# Patient Record
Sex: Male | Born: 1950 | Race: White | Hispanic: No | Marital: Married | Smoking: Former smoker
Health system: Southern US, Community
[De-identification: ages and names within clinical notes are randomized; demographics above are authoritative.]

## PROBLEM LIST (undated history)

## (undated) DIAGNOSIS — M1990 Unspecified osteoarthritis, unspecified site: Secondary | ICD-10-CM

## (undated) DIAGNOSIS — Z7960 Long term (current) use of unspecified immunomodulators and immunosuppressants: Secondary | ICD-10-CM

## (undated) DIAGNOSIS — Z79899 Other long term (current) drug therapy: Secondary | ICD-10-CM

## (undated) DIAGNOSIS — D126 Benign neoplasm of colon, unspecified: Secondary | ICD-10-CM

## (undated) DIAGNOSIS — I209 Angina pectoris, unspecified: Secondary | ICD-10-CM

## (undated) DIAGNOSIS — M1120 Other chondrocalcinosis, unspecified site: Secondary | ICD-10-CM

## (undated) DIAGNOSIS — I480 Paroxysmal atrial fibrillation: Secondary | ICD-10-CM

## (undated) DIAGNOSIS — G4733 Obstructive sleep apnea (adult) (pediatric): Secondary | ICD-10-CM

## (undated) DIAGNOSIS — K5790 Diverticulosis of intestine, part unspecified, without perforation or abscess without bleeding: Secondary | ICD-10-CM

## (undated) DIAGNOSIS — K219 Gastro-esophageal reflux disease without esophagitis: Secondary | ICD-10-CM

## (undated) DIAGNOSIS — N400 Enlarged prostate without lower urinary tract symptoms: Secondary | ICD-10-CM

## (undated) DIAGNOSIS — I499 Cardiac arrhythmia, unspecified: Secondary | ICD-10-CM

## (undated) DIAGNOSIS — G2581 Restless legs syndrome: Secondary | ICD-10-CM

## (undated) DIAGNOSIS — N281 Cyst of kidney, acquired: Secondary | ICD-10-CM

## (undated) DIAGNOSIS — I10 Essential (primary) hypertension: Secondary | ICD-10-CM

## (undated) DIAGNOSIS — R0602 Shortness of breath: Secondary | ICD-10-CM

## (undated) DIAGNOSIS — M542 Cervicalgia: Secondary | ICD-10-CM

## (undated) DIAGNOSIS — Z950 Presence of cardiac pacemaker: Secondary | ICD-10-CM

## (undated) DIAGNOSIS — G4730 Sleep apnea, unspecified: Secondary | ICD-10-CM

## (undated) DIAGNOSIS — E669 Obesity, unspecified: Secondary | ICD-10-CM

## (undated) DIAGNOSIS — R002 Palpitations: Secondary | ICD-10-CM

## (undated) DIAGNOSIS — G5603 Carpal tunnel syndrome, bilateral upper limbs: Secondary | ICD-10-CM

## (undated) DIAGNOSIS — M5417 Radiculopathy, lumbosacral region: Secondary | ICD-10-CM

## (undated) DIAGNOSIS — K8020 Calculus of gallbladder without cholecystitis without obstruction: Secondary | ICD-10-CM

## (undated) DIAGNOSIS — I429 Cardiomyopathy, unspecified: Secondary | ICD-10-CM

## (undated) DIAGNOSIS — M51369 Other intervertebral disc degeneration, lumbar region without mention of lumbar back pain or lower extremity pain: Secondary | ICD-10-CM

## (undated) DIAGNOSIS — M75102 Unspecified rotator cuff tear or rupture of left shoulder, not specified as traumatic: Secondary | ICD-10-CM

## (undated) DIAGNOSIS — I495 Sick sinus syndrome: Secondary | ICD-10-CM

## (undated) DIAGNOSIS — T4145XA Adverse effect of unspecified anesthetic, initial encounter: Secondary | ICD-10-CM

## (undated) DIAGNOSIS — Z7901 Long term (current) use of anticoagulants: Secondary | ICD-10-CM

## (undated) DIAGNOSIS — R001 Bradycardia, unspecified: Secondary | ICD-10-CM

## (undated) DIAGNOSIS — N529 Male erectile dysfunction, unspecified: Secondary | ICD-10-CM

## (undated) DIAGNOSIS — T8859XA Other complications of anesthesia, initial encounter: Secondary | ICD-10-CM

## (undated) HISTORY — PX: KNEE ARTHROSCOPY: SUR90

## (undated) HISTORY — PX: JOINT REPLACEMENT: SHX530

## (undated) HISTORY — PX: VASECTOMY: SHX75

## (undated) HISTORY — PX: COLON SURGERY: SHX602

## (undated) HISTORY — PX: LIPOMA EXCISION: SHX5283

## (undated) HISTORY — PX: CHOLECYSTECTOMY: SHX55

## (undated) HISTORY — PX: OTHER SURGICAL HISTORY: SHX169

---

## 2003-09-22 ENCOUNTER — Other Ambulatory Visit: Payer: Self-pay

## 2005-08-31 ENCOUNTER — Ambulatory Visit: Payer: Self-pay | Admitting: Family Medicine

## 2006-05-25 ENCOUNTER — Ambulatory Visit: Payer: Self-pay | Admitting: Gastroenterology

## 2006-06-27 ENCOUNTER — Other Ambulatory Visit: Payer: Self-pay

## 2006-07-03 ENCOUNTER — Ambulatory Visit: Payer: Self-pay | Admitting: General Practice

## 2007-07-18 ENCOUNTER — Ambulatory Visit: Payer: Self-pay | Admitting: Family Medicine

## 2010-04-28 ENCOUNTER — Ambulatory Visit: Payer: Self-pay | Admitting: Gastroenterology

## 2010-05-02 ENCOUNTER — Ambulatory Visit: Payer: Self-pay | Admitting: Gastroenterology

## 2010-05-05 ENCOUNTER — Ambulatory Visit: Payer: Self-pay | Admitting: Gastroenterology

## 2010-05-26 ENCOUNTER — Ambulatory Visit: Payer: Self-pay | Admitting: Surgery

## 2010-06-09 ENCOUNTER — Ambulatory Visit: Payer: Self-pay | Admitting: Surgery

## 2010-09-16 ENCOUNTER — Ambulatory Visit: Payer: Self-pay | Admitting: General Practice

## 2010-09-28 ENCOUNTER — Inpatient Hospital Stay: Payer: Self-pay | Admitting: General Practice

## 2010-09-28 HISTORY — PX: OTHER SURGICAL HISTORY: SHX169

## 2010-09-28 HISTORY — PX: TOTAL KNEE ARTHROPLASTY: SHX125

## 2011-02-14 ENCOUNTER — Ambulatory Visit: Payer: Self-pay | Admitting: Otolaryngology

## 2012-01-06 IMAGING — US US THYROID
1 series · 17 of 25 positions shown · non-contrast
Comparison: none

REASON FOR EXAM: RT thyroid mass
COMMENTS:

[Series 1: us thyroid · 17 of 28 slices shown]
[im 1/28]
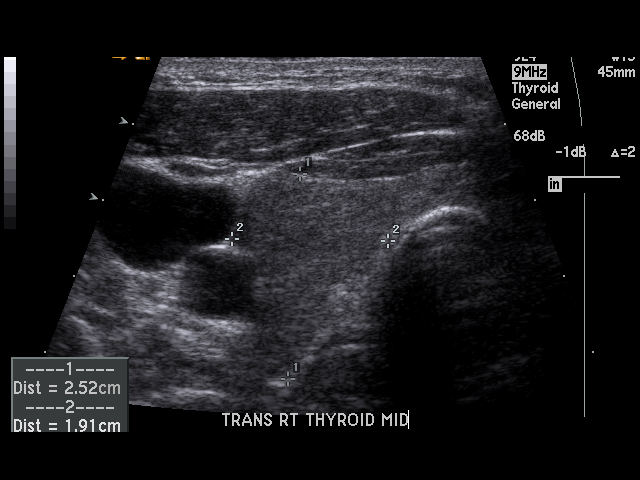
[im 3/28]
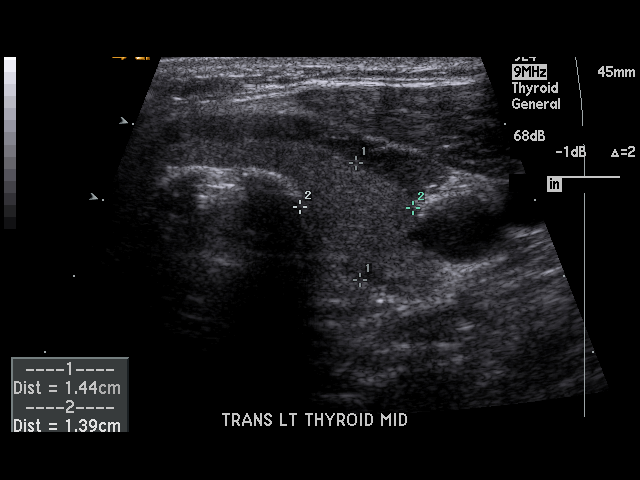
[im 4/28]
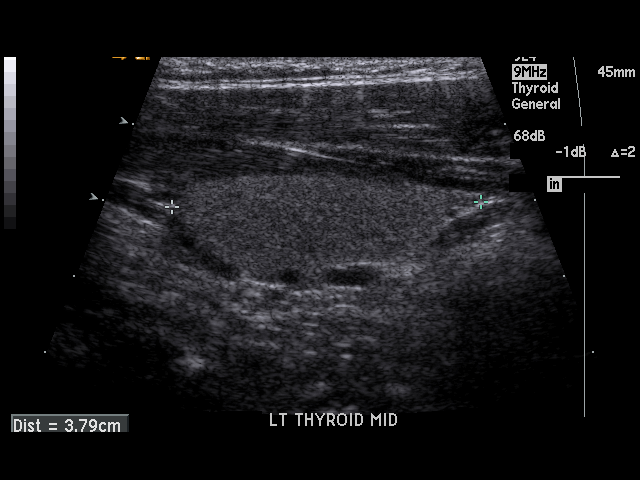
[im 6/28]
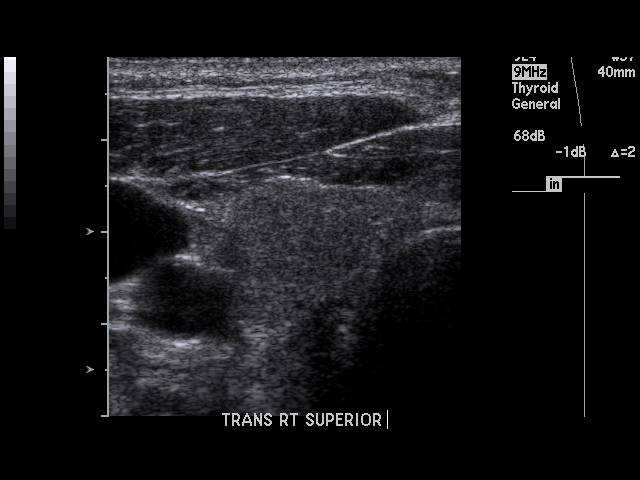
[im 7/28]
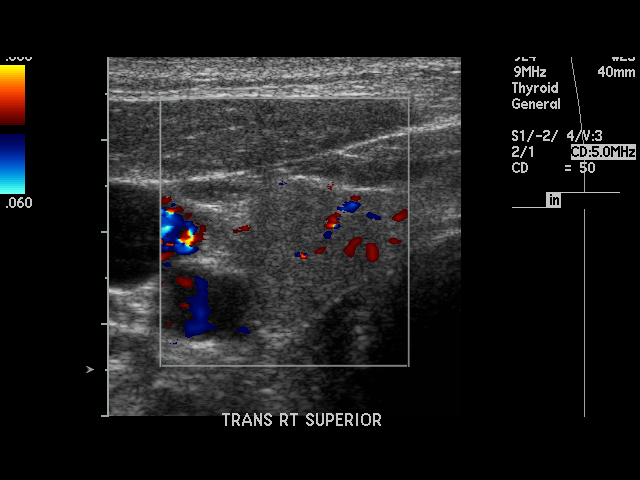
[im 10/28]
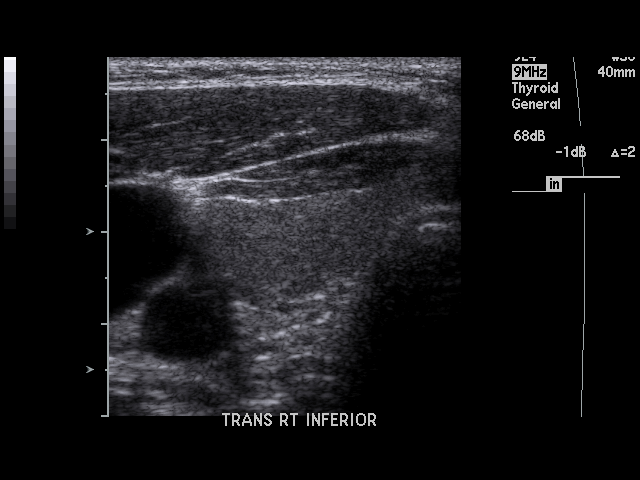
[im 11/28]
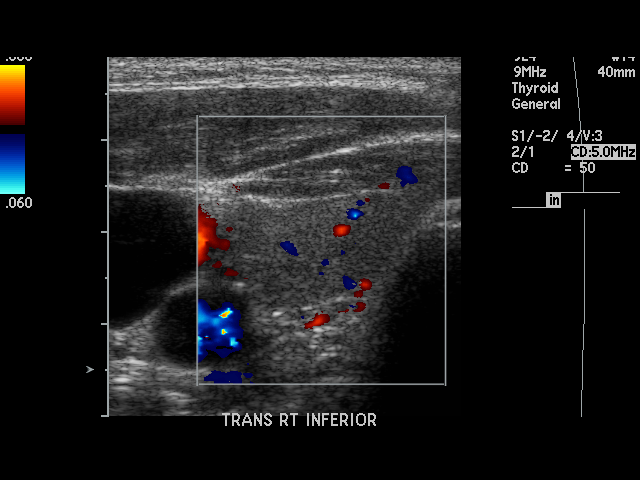
[im 13/28]
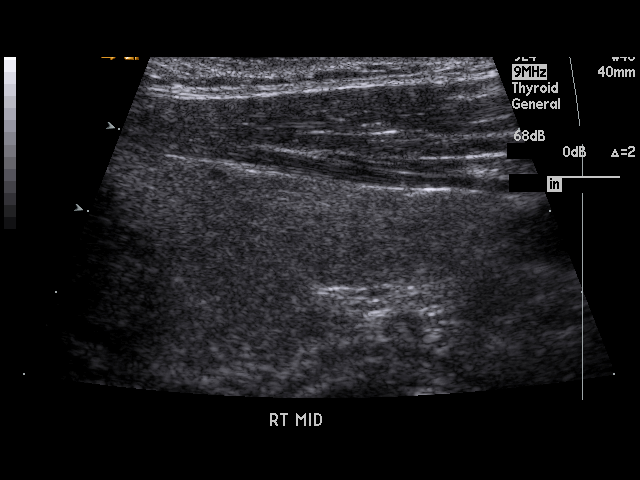
[im 14/28]
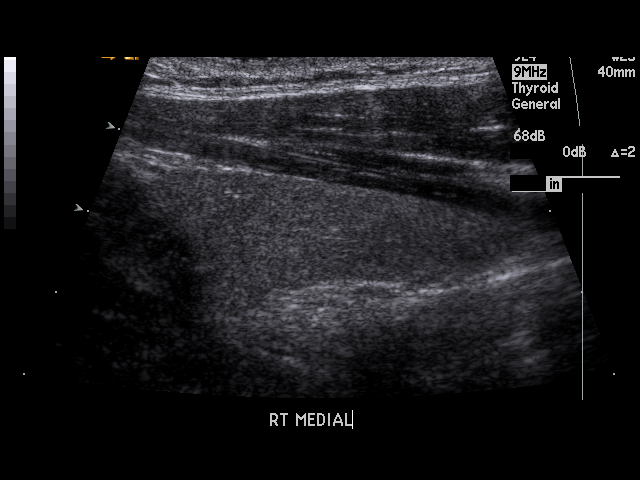
[im 15/28]
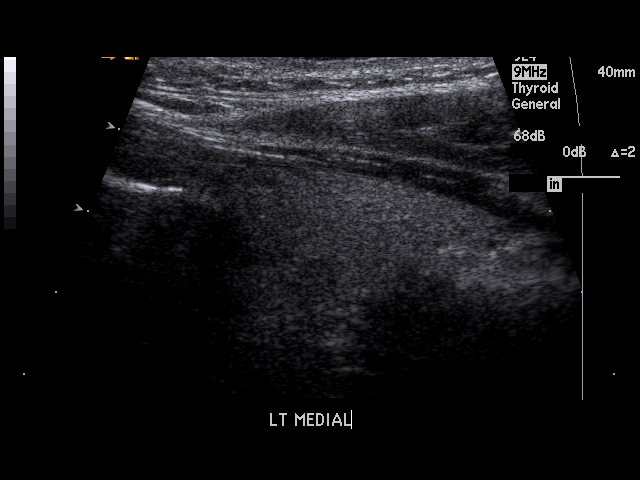
[im 17/28]
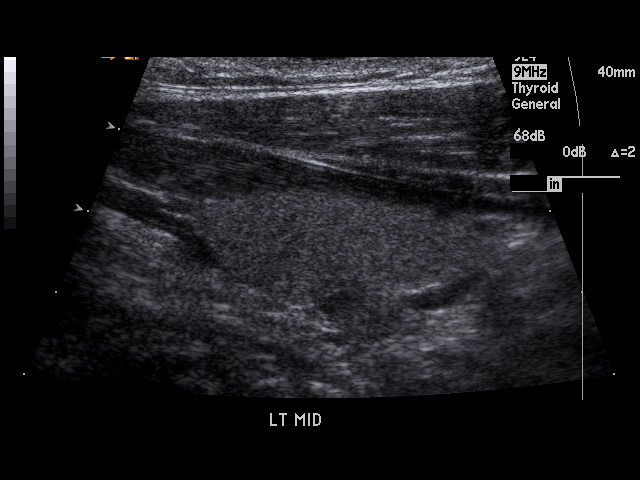
[im 19/28]
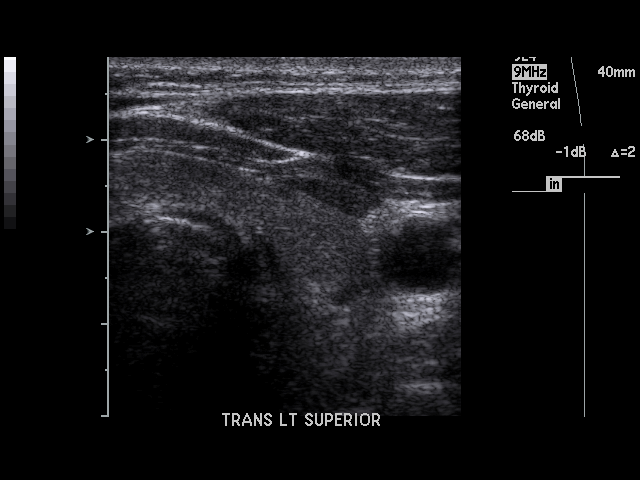
[im 21/28]
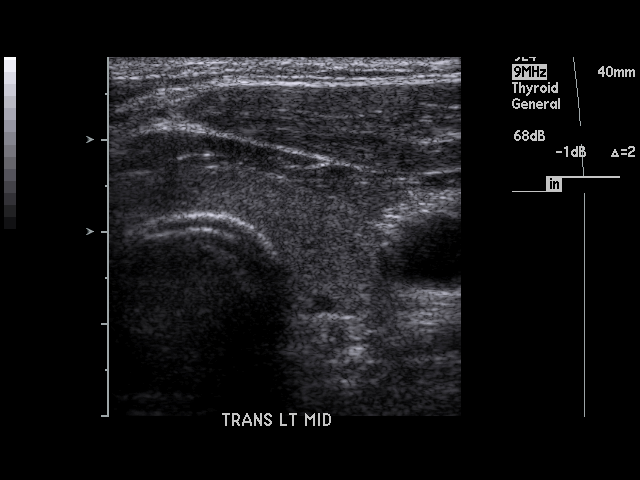
[im 22/28]
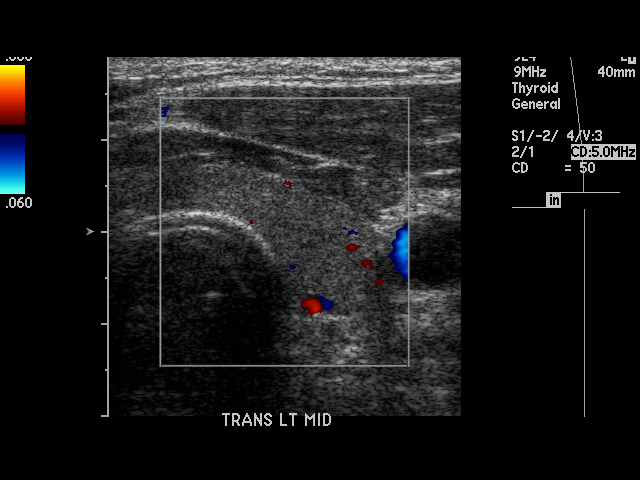
[im 24/28]
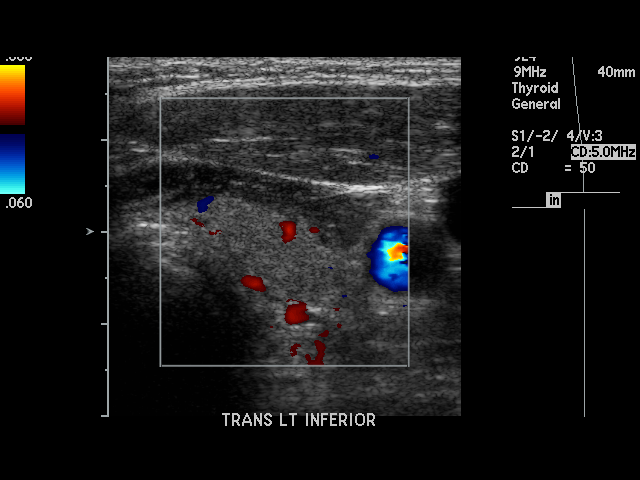
[im 25/28]
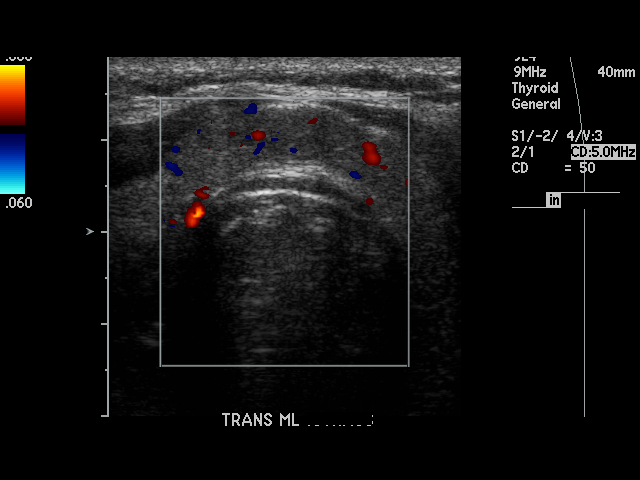
[im 28/28]
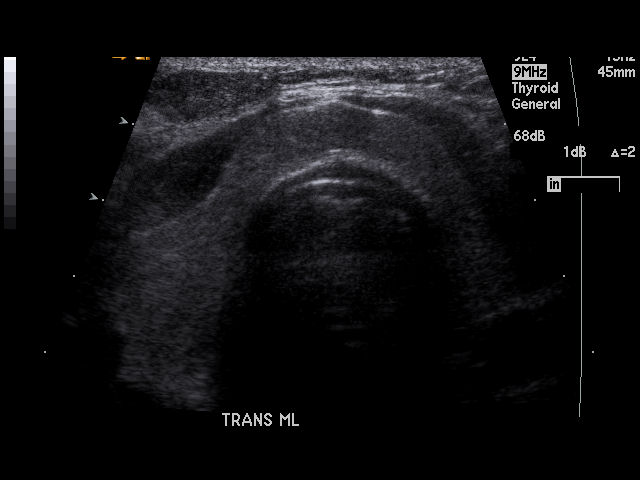

[17 of 25 positions shown; findings below may reference images not displayed]

PROCEDURE:     DINORA - DINORA THYROID  - February 14, 2011  [DATE]

RESULT:     The right lobe of thyroid measures 5.10 x 2.52 x 1.91 cm. The
left lobe measures 3.79 x 1.39 x 1.44 cm. The isthmus shows an anterior to
posterior thickness of 4.8 mm. Is no evidence of focal area of abnormal
echogenicity to suggest a solid or cystic mass.
IMPRESSION: Right lobe is at the upper limits of normal in size. No
focal mass evident.

## 2012-05-08 ENCOUNTER — Ambulatory Visit: Payer: Self-pay | Admitting: Family Medicine

## 2013-04-22 ENCOUNTER — Observation Stay: Payer: Self-pay | Admitting: Internal Medicine

## 2013-04-22 LAB — BASIC METABOLIC PANEL
Anion Gap: 8 (ref 7–16)
BUN: 9 mg/dL (ref 7–18)
Chloride: 103 mmol/L (ref 98–107)
Co2: 27 mmol/L (ref 21–32)
Creatinine: 1.11 mg/dL (ref 0.60–1.30)
EGFR (African American): >60
EGFR (Non-African Amer.): >60
Glucose: 78 mg/dL (ref 65–99)
Osmolality: 273 (ref 275–301)
Sodium: 138 mmol/L (ref 136–145)

## 2013-04-22 LAB — CBC
HCT: 39.3 % — ABNORMAL LOW (ref 40.0–52.0)
MCH: 30.2 pg (ref 26.0–34.0)
MCHC: 35.2 g/dL (ref 32.0–36.0)
MCV: 86 fL (ref 80–100)
Platelet: 164 10*3/uL (ref 150–440)
RBC: 4.58 10*6/uL (ref 4.40–5.90)
RDW: 13 % (ref 11.5–14.5)
WBC: 7.9 10*3/uL (ref 3.8–10.6)

## 2013-04-22 LAB — CK TOTAL AND CKMB (NOT AT ARMC): CK-MB: 7 ng/mL — ABNORMAL HIGH (ref 0.5–3.6)

## 2013-04-23 LAB — TROPONIN I: Troponin-I: <0.02 ng/mL

## 2013-05-20 ENCOUNTER — Ambulatory Visit: Payer: Self-pay | Admitting: Urology

## 2013-05-26 ENCOUNTER — Ambulatory Visit: Payer: Self-pay | Admitting: Urology

## 2013-12-25 DIAGNOSIS — N529 Male erectile dysfunction, unspecified: Secondary | ICD-10-CM | POA: Insufficient documentation

## 2013-12-25 DIAGNOSIS — R079 Chest pain, unspecified: Secondary | ICD-10-CM | POA: Insufficient documentation

## 2013-12-25 DIAGNOSIS — I10 Essential (primary) hypertension: Secondary | ICD-10-CM | POA: Insufficient documentation

## 2013-12-25 DIAGNOSIS — E8881 Metabolic syndrome: Secondary | ICD-10-CM | POA: Insufficient documentation

## 2013-12-25 DIAGNOSIS — K219 Gastro-esophageal reflux disease without esophagitis: Secondary | ICD-10-CM | POA: Insufficient documentation

## 2014-01-13 ENCOUNTER — Ambulatory Visit: Payer: Self-pay | Admitting: Family Medicine

## 2014-03-02 DIAGNOSIS — R0602 Shortness of breath: Secondary | ICD-10-CM | POA: Insufficient documentation

## 2014-07-02 DIAGNOSIS — J302 Other seasonal allergic rhinitis: Secondary | ICD-10-CM | POA: Insufficient documentation

## 2014-07-02 DIAGNOSIS — N400 Enlarged prostate without lower urinary tract symptoms: Secondary | ICD-10-CM | POA: Insufficient documentation

## 2014-07-02 DIAGNOSIS — G4730 Sleep apnea, unspecified: Secondary | ICD-10-CM | POA: Insufficient documentation

## 2015-01-08 NOTE — H&P (Signed)
PATIENT NAME:  [name], [name] MR#:  [id number] DATE OF BIRTH:  May 26, 1951  DATE OF ADMISSION:  05/26/2013  CHIEF COMPLAINT: Difficulty urinating.   HISTORY OF PRESENT ILLNESS: Mr. Valeriano is a 64 year old Caucasian male with a greater than 1 year history of progressive worsening of lower urinary tract symptoms. Symptoms at the present time include frequency, weak stream and nocturia. AUA symptom score was 16 with a quality of life score of 2. Evaluation in the office included uroflow study, cystoscopy, creatinine and prostate ultrasound. Studies were consistent with significant obstruction. Prostate ultrasound indicated a 50.3 gram prostate. PSA was 0.6 and creatinine was 1.09. The patient comes in now for photovaporization of the prostate with the green light laser.   ALLERGIES: No known drug allergies.   CURRENT MEDICATIONS: Include Avodart, doxazosin, lisinopril/HCTZ, omeprazole, aspirin, vitamin C, melatonin, vitamin B3, multivitamins, calcium, fluticasone.   PAST SURGICAL HISTORY:  1.  Bilateral vasectomy in the 1970s. 2.  Colonoscopic polyp removal in 2010. 3.  Right knee replacement in 2010. 4.  Cholecystectomy in 2012. 5.  Green light photovaporization of the prostate in 2000.   SOCIAL HISTORY: The patient denied tobacco or alcohol use.   FAMILY HISTORY: Remarkable for parents with heart disease, hypertension, chronic renal disease and stroke.   PAST AND CURRENT MEDICAL CONDITIONS:  1.  GERD. 2.  Hypertension.  3.  Obesity.   REVIEW OF SYSTEMS: The patient occasionally has ankle swelling. He denied chest pain, shortness of breath, diabetes, stroke, coronary artery disease or heart disease.   PHYSICAL EXAMINATION: GENERAL: Obese white male in no acute distress.  HEENT: Sclerae were clear. Pupils were equally round and reactive to light and accommodation. Extraocular movements were intact.  NECK: Supple. No palpable cervical adenopathy. No audible carotid bruits. Thyroid gland  was smooth and nontender. LYMPH:  No palpable neck or groin adenopathy.  PULMONARY: Lungs clear to auscultation. CARDIOVASCULAR: Regular rhythm and rate without audible murmurs or gallops.  ABDOMEN: Soft, nontender abdomen.  GENITOURINARY: Uncircumcised with buried penis. Testes smooth and nontender, 20 mL in size each.  RECTAL: Greater than 40 gram prostate.  NEUROMUSCULAR: Alert and oriented x 3.   IMPRESSION: Benign prostatic hypertrophy with bladder outlet obstruction.   PLAN: Photovaporization of the prostate with the green light laser.  ____________________________ Otelia Limes. Yves Dill, MD mrw:sb D: 05/20/2013 10:35:00 ET T: 05/20/2013 10:50:03 ET JOB#: [id number]  cc: Otelia Limes. Yves Dill, MD, <Dictator> Royston Cowper MD ELECTRONICALLY SIGNED 05/21/2013 11:53

## 2015-01-08 NOTE — Op Note (Signed)
PATIENT NAME:  [name], [name] MR#:  [id number] DATE OF BIRTH:  02-26-1951  DATE OF PROCEDURE:  05/26/2013  PREOPERATIVE DIAGNOSIS: Benign prostatic hypertrophy, with bladder outlet obstruction.   POSTOPERATIVE DIAGNOSIS: Benign prostatic hypertrophy, with bladder outlet obstruction.   PROCEDURE: Photovaporization of the prostate with the GreenLight laser.   SURGEON: Yves Dill.   ANESTHETIST: [employer] [facility].   ANESTHETIC METHOD: General, per [employer] [facility], and local per Yves Dill.   INDICATIONS: See the dictated history and physical. After informed consent, the patient requested the above procedure.   OPERATIVE SUMMARY: After adequate general anesthesia had been obtained, the patient was placed into dorsal lithotomy position and the perineum was prepped and draped in the usual fashion. The laser scope was then coupled with to camera and then visually advanced into the bladder. The bladder was moderately trabeculated. No bladder mucosal lesions were identified. Both ureteral orifices were identified and had clear efflux. The patient had trilobar BPH with intravesical growth of the median lobe.   At this point the GreenLight XPS laser fiber was introduced through the scope. The scope was then positioned at the bladder neck. Bladder neck was then vaporized at a power setting of  80 watts. Power was then increased to 120 watts, and remaining obstructive tissue was vaporized from the bladder neck back to the verumontanum. At this point, the scope was removed; 10 mL of viscous Xylocaine was instilled within the urethra and the bladder. A 95-MWUXLK silicone catheter was placed. The catheter was irrigated until clear. A B and O suppository was placed. The procedure was then terminated and the patient was transferred to the recovery room in stable condition.     ____________________________ Otelia Limes. Yves Dill, MD mrw:dm D: 05/26/2013 08:36:15 ET T: 05/26/2013 09:13:21 ET JOB#: [id number]  cc: Otelia Limes. Yves Dill,  MD, <Dictator> Royston Cowper MD ELECTRONICALLY SIGNED 05/26/2013 11:51

## 2015-01-08 NOTE — Discharge Summary (Signed)
PATIENT NAME:  [name], [name] MR#:  [id number] DATE OF BIRTH:  07-07-1951  DATE OF ADMISSION:  04/22/2013 DATE OF DISCHARGE:  04/23/2013  DISCHARGE DIAGNOSES: Chest pain, likely noncardiac with negative Myoview and negative serial cardiac enzymes. Could be muscular or gastrointestinal in origin.   SECONDARY DIAGNOSES:  1. Hypertension.  2. Obstructive sleep apnea.  3. Gastroesophageal reflux disease.  4. Benign prostatic hypertrophy.   CONSULTATIONS: None.   PROCEDURE AND RADIOLOGY: Myoview on the 6th of August was negative. EF of 53%. CT angio of chest on the 6th of August showed no evidence of PE. No evidence of thoracic aortic aneurysm or dissection. Noncalcified nodule within the left lower lobe. Chest x-ray on the 5th of August showed no acute cardiopulmonary disease.   HISTORY AND SHORT HOSPITAL COURSE: The patient is a 64 year old male with above-mentioned medical problems who was admitted for chest pain. Please see Dr. Zacarias Pontes dictated history and physical for further details. He was ruled out with 3 negative sets of troponin. He underwent Myoview which was negative. His chest pain was relieved and was close to his baseline. He was discharged home on the 6th of August in stable condition.   VITAL SIGNS: On the date of discharge, his vital signs are as follows: Temperature 97.2, heart rate 49 per minute, respirations 19 per minute, blood pressure 133/72 mmHg. He was saturating 96% on room air.   PERTINENT PHYSICAL EXAMINATION ON THE DATE OF DISCHARGE:  CARDIOVASCULAR: S1, S2 normal. No murmurs, rubs or gallop.  LUNGS: Clear to auscultation bilaterally. No wheezing, rales, rhonchi or crepitation.  ABDOMEN: Soft, benign.  NEUROLOGIC: Nonfocal examination.   All other physical examination remained at baseline.   DISCHARGE MEDICATIONS:  1. Doxazosin 8 mg 1/2 tablet p.o. daily.  2. Avodart 0.5 mg p.o. daily.  3. Hydrochlorothiazide/lisinopril 12.5/20 mg 1 tablet p.o. daily.  4.  Flonase 1 spray to each nares daily.  5. Melatonin 5 mg 2 tablets p.o. at bedtime.  6. Omeprazole 40 mg p.o. daily.  7. Calcium with vitamin D 1 tablet p.o. at bedtime.  8. Multivitamin once daily.  9. Aspirin 81 mg p.o. daily.  10. Vitamin C 1000 mg p.o. daily.   DISCHARGE DIET: Low sodium.   DISCHARGE ACTIVITY: As tolerated.   DISCHARGE INSTRUCTIONS AND FOLLOWUP: The patient was instructed to follow up with his primary care physician, Dr. Domenick Gong, in 1 to 2 weeks. He will need followup with [facility] in 2 to 4 weeks.   TOTAL TIME DISCHARGING THIS PATIENT: 45 minutes.   ____________________________ Lucina Mellow. Manuella Ghazi, MD vss:gb D: 04/23/2013 23:44:31 ET T: 04/24/2013 01:49:59 ET JOB#: [id number]  cc: Chia Rock S. Manuella Ghazi, MD, <Dictator> Fonnie Jarvis. Ilene Qua, MD [facility] Lucina Mellow [facility] MD ELECTRONICALLY SIGNED 04/25/2013 12:26

## 2015-01-08 NOTE — H&P (Signed)
PATIENT NAME:  [name], [name] MR#:  [id number] DATE OF BIRTH:  04/07/1951  DATE OF ADMISSION:  04/23/2013  PRIMARY CARE PHYSICIAN:  Dr. Domenick Gong.   REFERRING PHYSICIAN:  Dr. Harvest Dark.   CHIEF COMPLAINT:  Recurrent chest pain.   HISTORY OF PRESENT ILLNESS:  Mr. Pangilinan is a 64 year old Caucasian male with a history of hypertension, obstructive sleep apnea syndrome on CPAP and history of gastroesophageal reflux disease.  The patient was brought to the hospital for evaluation of chest pain.  He indicates that the chest pain started at 7:00 p.m. while sitting and without exertion.  He stated that prior to that he worked in the yard.  The chest pain is in the front of the chest radiating to the back and also radiating to the left shoulder.  The pain is described as sharp and aching with severity reaching 5 to 6 on a scale of 10, lasted about 20 minutes.  There is no associated shortness of breath.  No nausea, no vomiting.  He had similar pain earlier today in the morning, but was brief for about 15 minutes.  He indicates that this is the fourth episode over the last two weeks.  There is no precipitating factors or aggravating factors or relieving factors.  The patient was admitted to the hospital for further evaluation of his chest pain.   REVIEW OF SYSTEMS:  CONSTITUTIONAL:  Denies having any fever.  No chills.  No fatigue.  EYES:  No blurring of vision.  No double vision.  EARS, NOSE, THROAT:  No hearing impairment.  No sore throat.  No dysphagia.  CARDIOVASCULAR:  No shortness of breath.  No edema.  No syncope, but described the chest pain as above.  RESPIRATORY:  Chest pain as described.  No shortness of breath.  No cough.  No hemoptysis.  GASTROINTESTINAL:  No abdominal pain, no vomiting, no diarrhea.  GENITOURINARY:  No dysuria.  No frequency of urination.  MUSCULOSKELETAL:  No joint pain or swelling.  No muscular pain or swelling other than his chest pain.  INTEGUMENTARY:  No skin  rash.  No ulcers.  NEUROLOGY:  No focal weakness.  No seizure activity.  No headache.  PSYCHIATRY:  No anxiety.  No depression.  ENDOCRINE:  No polyuria or polydipsia.  No heat or cold intolerance.   PAST MEDICAL HISTORY:  Systemic hypertension, obstructive sleep apnea syndrome on CPAP at night, gastroesophageal reflux disease, benign prostatic hypertrophy.   PAST SURGICAL HISTORY:  Prostatic surgery, history of vasectomy in 1976, right knee joint arthroplasty in 2012.   FAMILY HISTORY:  His father died at the age of 66 from complications of stroke and he was in the preparation to have carotid surgery.  His mother died from old age and also complications of dementia and pneumonia.   SOCIAL HISTORY:  He is married, living with his wife.  He is retired from working as a [employer] manager.   SOCIAL HABITS:  Nonsmoker.  There is a remote history of smoking for a few years about 3 to 4 years.  He quit about 40 years ago.  No history of alcohol or drug abuse.   ADMISSION MEDICATIONS:  Vitamin C 1000 mg once a day, omeprazole 40 mg a day, multivitamin once a day, hydrochlorothiazide with lisinopril 12.5/20.  Melatonin 5 mg taking 2 tablets at night, fluticasone nasal spray once a day, doxazosin 4 mg once a day, calcium and vitamin D once a day, Avodart 0.5 mg once a day,  aspirin 81 mg a day.   ALLERGIES:  ANTIHISTAMINE CAUSING SOME MENTAL STATUS CHANGE OR HALLUCINATIONS.   PHYSICAL EXAMINATION: VITAL SIGNS:  Blood pressure 109/52.  Of note, his blood pressure at the time EMS evaluated him, transferred him to the Emergency Department, the blood pressure at that time was 150/81.  Pulse 54, temperature 98.1, oxygen saturation 94%.  GENERAL APPEARANCE:  A 64 year old male lying in bed in no acute distress.  HEAD AND NECK:  No pallor.  No icterus.  No cyanosis.  Ear examination revealed normal hearing, no discharge, no lesions.  Examination of the nose showed no discharge, no lesions, no bleeding.   Examination of the mouth showed normal lips and tongue, no ulcers, no oral thrush.  Eye examination revealed normal eyelids and conjunctivae.  Pupils about 4 mm, round, equal and reactive to light.  NECK:  Supple.  Trachea at midline.  No thyromegaly.  No cervical lymphadenopathy.  No masses.  HEART:  Revealed normal S1, S2.  No S3, S4.  No murmur.  No gallop.  No carotid bruits.  RESPIRATORY:  Revealed normal breathing pattern without use of accessory muscles.  No rales.  No wheezing.  ABDOMEN:  Soft without tenderness.  No hepatosplenomegaly.  No masses.  No hernias.  SKIN:  Revealed no ulcers.  No subcutaneous nodules.  MUSCULOSKELETAL:  No joint swelling.  No clubbing.  NEUROLOGIC:  Cranial nerves II through XII are intact.  No focal motor deficit.  PSYCHIATRIC:  The patient is alert, oriented x 3.  Mood and affect were normal.   LABORATORY FINDINGS:  His EKG showed sinus bradycardia at a rate of 59 per minute, otherwise unremarkable EKG.  His chest x-ray showed no acute cardiopulmonary abnormality.  His serum glucose 78, BUN 9, creatinine 1.1, sodium 138, potassium 3.6, calcium 8.7.  Total CPK 235.  Troponin less than 0.02.  CBC showed a white count of 7000, hemoglobin 13, hematocrit 39 and platelet count 164.   ASSESSMENT: 1.  Recurrent chest pain at mid chest area radiating to the back and left shoulder.  This is recurrent for the last two weeks.  Differential diagnosis will include cardiac pain versus pulmonary versus vascular, musculoskeletal versus gastrointestinal in origin.  2.  Systemic hypertension.  3.  Obstructive sleep apnea on CPAP treatment.  4.  Gastroesophageal reflux disease.  5.  Osteoarthritis.  6.  Benign prostatic hypertrophy.   PLAN:  We will admit to telemetry for observation, follow up on troponin q. 8 hours x 3.  We will obtain CT angiogram of the chest to rule out aortic dissection.  Oxygen supplementation, aspirin and small dose of beta-blocker using Coreg 3.125 mg  twice a day.  Try to avoid higher dose due to his bradycardia.  We will follow up on the troponins.  If they are normal, I already scheduled him to have a nuclear stress test.  Use CPAP for his sleep apnea.  We will place the patient on Lovenox subcutaneously for deep vein thrombosis prophylaxis.   Time spent in evaluating this patient took more than 55 minutes.    ____________________________ Clovis Pu. Lenore Manner, MD amd:ea D: 04/23/2013 00:04:00 ET T: 04/23/2013 02:12:59 ET JOB#: [id number]  cc: Clovis Pu. Lenore Manner, MD, <Dictator> Ellin Saba MD ELECTRONICALLY SIGNED 04/23/2013 6:21

## 2015-04-08 DIAGNOSIS — M5417 Radiculopathy, lumbosacral region: Secondary | ICD-10-CM | POA: Insufficient documentation

## 2015-04-08 DIAGNOSIS — M1812 Unilateral primary osteoarthritis of first carpometacarpal joint, left hand: Secondary | ICD-10-CM | POA: Insufficient documentation

## 2015-06-23 ENCOUNTER — Encounter: Payer: Self-pay | Admitting: *Deleted

## 2015-06-24 ENCOUNTER — Encounter
Admission: RE | Disposition: A | Discharge status: Home / Self Care | Payer: Self-pay | Source: Ambulatory Visit | Attending: Gastroenterology

## 2015-06-24 ENCOUNTER — Ambulatory Visit
Admission: RE | Admit: 2015-06-24 | Discharge: 2015-06-24 | Disposition: A | Discharge status: Home / Self Care | Payer: BLUE CROSS/BLUE SHIELD | Source: Ambulatory Visit | Attending: Gastroenterology | Admitting: Gastroenterology

## 2015-06-24 ENCOUNTER — Ambulatory Visit: Payer: BLUE CROSS/BLUE SHIELD | Admitting: Certified Registered"

## 2015-06-24 DIAGNOSIS — Z1211 Encounter for screening for malignant neoplasm of colon: Secondary | ICD-10-CM | POA: Diagnosis present

## 2015-06-24 DIAGNOSIS — Z800 Family history of malignant neoplasm of digestive organs: Secondary | ICD-10-CM | POA: Diagnosis not present

## 2015-06-24 DIAGNOSIS — K5730 Diverticulosis of large intestine without perforation or abscess without bleeding: Secondary | ICD-10-CM | POA: Insufficient documentation

## 2015-06-24 DIAGNOSIS — G4730 Sleep apnea, unspecified: Secondary | ICD-10-CM | POA: Diagnosis not present

## 2015-06-24 DIAGNOSIS — Z87891 Personal history of nicotine dependence: Secondary | ICD-10-CM | POA: Insufficient documentation

## 2015-06-24 DIAGNOSIS — Z86010 Personal history of colonic polyps: Secondary | ICD-10-CM | POA: Insufficient documentation

## 2015-06-24 DIAGNOSIS — R252 Cramp and spasm: Secondary | ICD-10-CM | POA: Diagnosis not present

## 2015-06-24 DIAGNOSIS — E669 Obesity, unspecified: Secondary | ICD-10-CM | POA: Insufficient documentation

## 2015-06-24 DIAGNOSIS — M1990 Unspecified osteoarthritis, unspecified site: Secondary | ICD-10-CM | POA: Diagnosis not present

## 2015-06-24 DIAGNOSIS — Z79899 Other long term (current) drug therapy: Secondary | ICD-10-CM | POA: Insufficient documentation

## 2015-06-24 DIAGNOSIS — N400 Enlarged prostate without lower urinary tract symptoms: Secondary | ICD-10-CM | POA: Insufficient documentation

## 2015-06-24 DIAGNOSIS — I10 Essential (primary) hypertension: Secondary | ICD-10-CM | POA: Diagnosis not present

## 2015-06-24 DIAGNOSIS — Z6839 Body mass index (BMI) 39.0-39.9, adult: Secondary | ICD-10-CM | POA: Diagnosis not present

## 2015-06-24 DIAGNOSIS — K219 Gastro-esophageal reflux disease without esophagitis: Secondary | ICD-10-CM | POA: Insufficient documentation

## 2015-06-24 HISTORY — DX: Male erectile dysfunction, unspecified: N52.9

## 2015-06-24 HISTORY — DX: Essential (primary) hypertension: I10

## 2015-06-24 HISTORY — DX: Diverticulosis of intestine, part unspecified, without perforation or abscess without bleeding: K57.90

## 2015-06-24 HISTORY — PX: COLONOSCOPY WITH PROPOFOL: SHX5780

## 2015-06-24 HISTORY — DX: Obesity, unspecified: E66.9

## 2015-06-24 HISTORY — DX: Benign prostatic hyperplasia without lower urinary tract symptoms: N40.0

## 2015-06-24 HISTORY — DX: Benign neoplasm of colon, unspecified: D12.6

## 2015-06-24 HISTORY — DX: Angina pectoris, unspecified: I20.9

## 2015-06-24 HISTORY — DX: Gastro-esophageal reflux disease without esophagitis: K21.9

## 2015-06-24 HISTORY — DX: Sleep apnea, unspecified: G47.30

## 2015-06-24 HISTORY — DX: Radiculopathy, lumbosacral region: M54.17

## 2015-06-24 HISTORY — DX: Shortness of breath: R06.02

## 2015-06-24 HISTORY — DX: Unspecified osteoarthritis, unspecified site: M19.90

## 2015-06-24 SURGERY — COLONOSCOPY WITH PROPOFOL
Anesthesia: General

## 2015-06-24 MED ORDER — PROPOFOL 10 MG/ML IV BOLUS
INTRAVENOUS | Status: DC | PRN
  Administered 2015-06-24: 40 mg via INTRAVENOUS

## 2015-06-24 MED ORDER — LIDOCAINE HCL (CARDIAC) 20 MG/ML IV SOLN
INTRAVENOUS | Status: DC | PRN
Start: 2015-06-24 — End: 2015-06-24
  Administered 2015-06-24: 60 mg via INTRAVENOUS

## 2015-06-24 MED ORDER — SODIUM CHLORIDE 0.9 % IV SOLN
INTRAVENOUS | Status: DC
  Administered 2015-06-24 (×2): via INTRAVENOUS

## 2015-06-24 MED ORDER — EPHEDRINE SULFATE 50 MG/ML IJ SOLN
INTRAMUSCULAR | Status: DC | PRN
  Administered 2015-06-24 (×2): 10 mg via INTRAVENOUS
  Administered 2015-06-24: 5 mg via INTRAVENOUS

## 2015-06-24 MED ORDER — GLYCOPYRROLATE 0.2 MG/ML IJ SOLN
INTRAMUSCULAR | Status: DC | PRN
  Administered 2015-06-24: 0.2 mg via INTRAVENOUS

## 2015-06-24 MED ORDER — SODIUM CHLORIDE 0.9 % IV SOLN: INTRAVENOUS | Status: DC

## 2015-06-24 MED ORDER — PROPOFOL 500 MG/50ML IV EMUL
INTRAVENOUS | Status: DC | PRN
  Administered 2015-06-24: 140 ug/kg/min via INTRAVENOUS

## 2015-06-24 NOTE — Transfer of Care (Signed)
Immediate Anesthesia Transfer of Care Note  Patient: [name]  Procedure(s) Performed: Procedure(s): COLONOSCOPY WITH PROPOFOL (N/A)  Patient Location: Endoscopy Unit  Anesthesia Type:General  Level of Consciousness: awake, alert , oriented and patient cooperative  Airway & Oxygen Therapy: Patient Spontanous Breathing and Patient connected to nasal cannula oxygen  Post-op Assessment: Report given to RN, Post -op Vital signs reviewed and stable and Patient moving all extremities X 4  Post vital signs: Reviewed and stable  Last Vitals:  Filed Vitals:   06/24/15 0826  BP: 102/48  Pulse: 65  Temp: 36.3 C  Resp: 14    Complications: No apparent anesthesia complications

## 2015-06-24 NOTE — Anesthesia Postprocedure Evaluation (Signed)
  Anesthesia Post-op Note  Patient: [name]  Procedure(s) Performed: Procedure(s): COLONOSCOPY WITH PROPOFOL (N/A)  Anesthesia type:General  Patient location: PACU  Post pain: Pain level controlled  Post assessment: Post-op Vital signs reviewed, Patient's Cardiovascular Status Stable, Respiratory Function Stable, Patent Airway and No signs of Nausea or vomiting  Post vital signs: Reviewed and stable  Last Vitals:  Filed Vitals:   06/24/15 0830  BP: 96/52  Pulse: 69  Temp:   Resp: 15    Level of consciousness: awake, alert  and patient cooperative  Complications: No apparent anesthesia complications

## 2015-06-24 NOTE — H&P (Signed)
Primary Care Physician:  [facility], MD Primary Gastroenterologist:  Dr. Candace Cruise  Pre-Procedure History & Physical: HPI:  [name] is a 64 y.o. male is here for an EGD/colonoscopy  Past Medical History  Diagnosis Date  . GERD (gastroesophageal reflux disease)   . Anginal pain ([place])   . Hypertension   . Sleep apnea   . Obesity   . Erectile dysfunction   . SOB (shortness of breath)   . BPH (benign prostatic hyperplasia)   . Osteoarthritis     first metacarpal of left hand  . Lumbosacral radiculitis   . Adenomatous polyp of colon   . Diverticulosis     Past Surgical History  Procedure Laterality Date  . Prostratate surgery    . Excision of of lipoma    . Colon surgery    . Cholecystectomy    . Vasectomy    . Knee artthroscopy Right   . Joint replacement    . Rtk  Right     total knee    Prior to Admission medications   Medication Sig Start Date End Date Taking? Authorizing Provider  Ascorbic Acid (VITAMIN C) 100 MG tablet Take 100 mg by mouth daily.   Yes Historical Provider, MD  calcium carbonate (OSCAL) 1500 (600 CA) MG TABS tablet Take 600 mg of elemental calcium by mouth 2 (two) times daily with a meal.   Yes Historical Provider, MD  cyclobenzaprine (FLEXERIL) 10 MG tablet Take 10 mg by mouth 3 (three) times daily as needed for muscle spasms.   Yes Historical Provider, MD  Ergocalciferol (VITAMIN D2) 2000 UNITS TABS Take 1 tablet by mouth.   Yes Historical Provider, MD  etodolac (LODINE) 500 MG tablet Take 500 mg by mouth 2 (two) times daily.   Yes Historical Provider, MD  fluticasone (FLONASE) 50 MCG/ACT nasal spray Place 2 sprays into both nostrils daily.   Yes Historical Provider, MD  furosemide (LASIX) 40 MG tablet Take 40 mg by mouth daily.   Yes Historical Provider, MD  lisinopril-hydrochlorothiazide (PRINZIDE,ZESTORETIC) 20-12.5 MG tablet Take 1 tablet by mouth daily.   Yes Historical Provider, MD  Melatonin 1 MG CAPS Take 1 mg by mouth.   Yes  Historical Provider, MD  Multiple Vitamin (MULTIVITAMIN) capsule Take 1 capsule by mouth daily.   Yes Historical Provider, MD  omeprazole (PRILOSEC) 40 MG capsule Take 40 mg by mouth daily.   Yes Historical Provider, MD    Allergies as of 04/28/2015  . (Not on File)    History reviewed. No pertinent family history.  Social History   Social History  . Marital Status: Married    Spouse Name: N/A  . Number of Children: N/A  . Years of Education: N/A   Occupational History  . Not on file.   Social History Main Topics  . Smoking status: Former Research scientist (life sciences)  . Smokeless tobacco: Not on file  . Alcohol Use: No  . Drug Use: No  . Sexual Activity: Not on file   Other Topics Concern  . Not on file   Social History Narrative  . No narrative on file    Review of Systems: See HPI, otherwise negative ROS  Physical Exam: BP 124/54 mmHg  Pulse 54  Temp(Src) 97.3 F (36.3 C) (Tympanic)  Resp 17  Ht 5\' 11"  (1.803 m)  Wt 127.007 kg (280 lb)  BMI 39.07 kg/m2  SpO2 99% General:   Alert,  pleasant and cooperative in NAD Head:  Normocephalic and  atraumatic. Neck:  Supple; no masses or thyromegaly. Lungs:  Clear throughout to auscultation.    Heart:  Regular rate and rhythm. Abdomen:  Soft, nontender and nondistended. Normal bowel sounds, without guarding, and without rebound.   Neurologic:  Alert and  oriented x4;  grossly normal neurologically.  Impression/Plan: [name] is here for an colonoscopy to be performed for family hx of colon cancer/personal hx of colon polyps.  Risks, benefits, limitations, and alternatives regarding  colonoscopy have been reviewed with the patient.  Questions have been answered.  All parties agreeable.   Fe Okubo, Lupita Dawn, MD  06/24/2015, 8:01 AM

## 2015-06-24 NOTE — Anesthesia Preprocedure Evaluation (Signed)
Anesthesia Evaluation  Patient identified by MRN, date of birth, ID band Patient awake    Reviewed: Allergy & Precautions, NPO status , Patient's Chart, lab work & pertinent test results  Airway Mallampati: II       Dental no notable dental hx.    Pulmonary sleep apnea , former smoker,     + decreased breath sounds      Cardiovascular Exercise Tolerance: Good hypertension, Pt. on medications Normal cardiovascular exam     Neuro/Psych negative neurological ROS     GI/Hepatic Neg liver ROS, GERD  ,  Endo/Other  negative endocrine ROS  Renal/GU negative Renal ROS     Musculoskeletal negative musculoskeletal ROS (+)   Abdominal (+) + obese,   Peds negative pediatric ROS (+)  Hematology negative hematology ROS (+)   Anesthesia Other Findings   Reproductive/Obstetrics                             Anesthesia Physical Anesthesia Plan  ASA: II  Anesthesia Plan: General   Post-op Pain Management:    Induction: Intravenous  Airway Management Planned: Nasal Cannula  Additional Equipment:   Intra-op Plan:   Post-operative Plan:   Informed Consent: I have reviewed the patients History and Physical, chart, labs and discussed the procedure including the risks, benefits and alternatives for the proposed anesthesia with the patient or authorized representative who has indicated his/her understanding and acceptance.     Plan Discussed with: CRNA  Anesthesia Plan Comments:         Anesthesia Quick Evaluation

## 2015-06-24 NOTE — Op Note (Signed)
[facility] Gastroenterology Patient Name: [name] Procedure Date: 06/24/2015 8:01 AM MRN: [id number] Account #: [id number] Date of Birth: 03/01/1951 Admit Type: Outpatient Age: 64 Room: [facility] ENDO ROOM 4 Gender: Male Note Status: Finalized Procedure:         Colonoscopy Indications:       Family history of colon cancer in a first-degree relative,                     Personal history of colonic polyps Providers:         Lupita Dawn. Candace Cruise, MD Referring MD:      Sofie Hartigan (Referring MD) Medicines:         Monitored Anesthesia Care Complications:     No immediate complications. Procedure:         Pre-Anesthesia Assessment:                    - Prior to the procedure, a History and Physical was                     performed, and patient medications, allergies and                     sensitivities were reviewed. The patient's tolerance of                     previous anesthesia was reviewed.                    - The risks and benefits of the procedure and the sedation                     options and risks were discussed with the patient. All                     questions were answered and informed consent was obtained.                    - After reviewing the risks and benefits, the patient was                     deemed in satisfactory condition to undergo the procedure.                    After obtaining informed consent, the colonoscope was                     passed under direct vision. Throughout the procedure, the                     patient's blood pressure, pulse, and oxygen saturations                     were monitored continuously. The Olympus PCF-H180AL                     colonoscope ( S#: [id number] ) was introduced through the                     anus and advanced to the the cecum, identified by                     appendiceal orifice and ileocecal valve. The colonoscopy  was performed without difficulty. The patient tolerated                   the procedure well. The quality of the bowel preparation                     was fair. Findings:      Multiple small-mouthed diverticula were found in the sigmoid colon.      The exam was otherwise without abnormality. Impression:        - Diverticulosis in the sigmoid colon.                    - The examination was otherwise normal.                    - No specimens collected. Recommendation:    - Discharge patient to home.                    - Repeat colonoscopy in 5 years for surveillance.                    - The findings and recommendations were discussed with the                     patient. Procedure Code(s): --- Professional ---                    [phone], Colonoscopy, flexible; diagnostic, including                     collection of specimen(s) by brushing or washing, when                     performed (separate procedure) Diagnosis Code(s): --- Professional ---                    Z80.0, Family history of malignant neoplasm of digestive                     organs                    Z86.010, Personal history of colonic polyps                    K57.30, Diverticulosis of large intestine without                     perforation or abscess without bleeding CPT copyright 2014 American Medical Association. All rights reserved. The codes documented in this report are preliminary and upon coder review may  be revised to meet current compliance requirements. Hulen Luster, MD 06/24/2015 8:20:46 AM This report has been signed electronically. Number of Addenda: 0 Note Initiated On: 06/24/2015 8:01 AM Scope Withdrawal Time: 0 hours 6 minutes 43 seconds  Total Procedure Duration: 0 hours 11 minutes 55 seconds       [facility]

## 2015-06-28 ENCOUNTER — Encounter: Payer: Self-pay | Admitting: Gastroenterology

## 2015-08-17 ENCOUNTER — Other Ambulatory Visit: Payer: BLUE CROSS/BLUE SHIELD

## 2015-08-18 ENCOUNTER — Other Ambulatory Visit: Payer: Self-pay | Admitting: Family Medicine

## 2015-08-18 DIAGNOSIS — M5412 Radiculopathy, cervical region: Secondary | ICD-10-CM

## 2015-08-18 DIAGNOSIS — G2581 Restless legs syndrome: Secondary | ICD-10-CM | POA: Insufficient documentation

## 2015-08-18 DIAGNOSIS — R7302 Impaired glucose tolerance (oral): Secondary | ICD-10-CM | POA: Insufficient documentation

## 2015-09-02 ENCOUNTER — Ambulatory Visit: Admission: RE | Admit: 2015-09-02 | Payer: BLUE CROSS/BLUE SHIELD | Source: Ambulatory Visit

## 2015-09-23 ENCOUNTER — Ambulatory Visit: Payer: BLUE CROSS/BLUE SHIELD

## 2015-10-07 ENCOUNTER — Encounter
Admission: RE | Admit: 2015-10-07 | Discharge: 2015-10-07 | Disposition: A | Discharge status: Home / Self Care | Payer: BLUE CROSS/BLUE SHIELD | Source: Ambulatory Visit | Attending: Orthopedic Surgery | Admitting: Orthopedic Surgery

## 2015-10-07 DIAGNOSIS — I10 Essential (primary) hypertension: Secondary | ICD-10-CM

## 2015-10-07 DIAGNOSIS — Z01812 Encounter for preprocedural laboratory examination: Secondary | ICD-10-CM | POA: Diagnosis present

## 2015-10-07 DIAGNOSIS — Z01810 Encounter for preprocedural cardiovascular examination: Secondary | ICD-10-CM | POA: Insufficient documentation

## 2015-10-07 HISTORY — DX: Adverse effect of unspecified anesthetic, initial encounter: T41.45XA

## 2015-10-07 HISTORY — DX: Other complications of anesthesia, initial encounter: T88.59XA

## 2015-10-07 LAB — CBC
HEMATOCRIT: 43 % (ref 40.0–52.0)
HEMOGLOBIN: 14.8 g/dL (ref 13.0–18.0)
MCH: 30.4 pg (ref 26.0–34.0)
MCHC: 34.5 g/dL (ref 32.0–36.0)
MCV: 88.2 fL (ref 80.0–100.0)
PLATELETS: 137 10*3/uL — AB (ref 150–440)
RBC: 4.88 MIL/uL (ref 4.40–5.90)
RDW: 13.5 % (ref 11.5–14.5)
WBC: 5.5 10*3/uL (ref 3.8–10.6)

## 2015-10-07 LAB — SEDIMENTATION RATE: Sed Rate: 6 mm/hr (ref 0–20)

## 2015-10-07 LAB — BASIC METABOLIC PANEL
ANION GAP: 8 (ref 5–15)
BUN: 16 mg/dL (ref 6–20)
CO2: 29 mmol/L (ref 22–32)
Calcium: 9.4 mg/dL (ref 8.9–10.3)
Chloride: 101 mmol/L (ref 101–111)
Creatinine, Ser: 0.99 mg/dL (ref 0.61–1.24)
GLUCOSE: 90 mg/dL (ref 65–99)
POTASSIUM: 4 mmol/L (ref 3.5–5.1)
Sodium: 138 mmol/L (ref 135–145)

## 2015-10-07 LAB — TYPE AND SCREEN
ABO/RH(D): B NEG
ANTIBODY SCREEN: NEGATIVE

## 2015-10-07 LAB — URINALYSIS COMPLETE WITH MICROSCOPIC (ARMC ONLY)
BACTERIA UA: NONE SEEN
BILIRUBIN URINE: NEGATIVE
GLUCOSE, UA: NEGATIVE mg/dL
Hgb urine dipstick: NEGATIVE
KETONES UR: NEGATIVE mg/dL
LEUKOCYTES UA: NEGATIVE
Nitrite: NEGATIVE
Protein, ur: NEGATIVE mg/dL
SQUAMOUS EPITHELIAL / LPF: NONE SEEN
Specific Gravity, Urine: 1.005 (ref 1.005–1.030)
WBC, UA: NONE SEEN WBC/hpf (ref 0–5)
pH: 6 (ref 5.0–8.0)

## 2015-10-07 LAB — SURGICAL PCR SCREEN
MRSA, PCR: NEGATIVE
STAPHYLOCOCCUS AUREUS: NEGATIVE

## 2015-10-07 LAB — ABO/RH: ABO/RH(D): B NEG

## 2015-10-07 LAB — PROTIME-INR
INR: 1.15
PROTHROMBIN TIME: 14.9 s (ref 11.4–15.0)

## 2015-10-07 LAB — APTT: APTT: 31 s (ref 24–36)

## 2015-10-07 NOTE — Patient Instructions (Signed)
  Your procedure is scheduled on: Monday 10/25/2015 Report to Day Surgery. 2ND FLOOR MEDICAL MALL ENTRANCE To find out your arrival time please call [phone] between 1PM - 3PM on Friday 10/22/2015.  Remember: Instructions that are not followed completely may result in serious medical risk, up to and including death, or upon the discretion of your surgeon and anesthesiologist your surgery may need to be rescheduled.    __X__ 1. Do not eat food or drink liquids after midnight. No gum chewing or hard candies.     __X__ 2. No Alcohol for 24 hours before or after surgery.   ____ 3. Bring all medications with you on the day of surgery if instructed.    __X__ 4. Notify your doctor if there is any change in your medical condition     (cold, fever, infections).     Do not wear jewelry, make-up, hairpins, clips or nail polish.  Do not wear lotions, powders, or perfumes. .  Do not shave 48 hours prior to surgery. Men may shave face and neck.  Do not bring valuables to the hospital.    [facility] is not responsible for any belongings or valuables               Contacts, dentures or bridgework may not be worn into surgery.  Leave your suitcase in the car. After surgery it may be brought to your room.  For patients admitted to the hospital, discharge time is determined by your                treatment team.   Patients discharged the day of surgery will not be allowed to drive home.   Please read over the following fact sheets that you were given:   MRSA Information and Surgical Site Infection Prevention   __X__ Take these medicines the morning of surgery with A SIP OF WATER:    1. DOXAZOSIN, LISINOPRIL/HCTZ, OMEPRAZOLE AT NIGHT AS USUAL  2. EXTRA DOSE OMEPRAZOLE DAY OF SURGERY  3.   4.  5.  6.  ____ Fleet Enema (as directed)   __X__ Use CHG Soap as directed  ____ Use inhalers on the day of surgery  ____ Stop metformin 2 days prior to surgery    ____ Take 1/2 of usual insulin dose  the night before surgery and none on the morning of surgery.   __X__ Stop Coumadin/Plavix/aspirin on 10/16/2015  __X__ Stop Anti-inflammatories on 10/18/2015 (NAPROXEN)   __X__ Stop supplements until after surgery. 10/18/2015 (VITAMIN C, B12, FISH OIL, MELATONIN)  __X__ Bring C-Pap to the hospital.

## 2015-10-09 LAB — URINE CULTURE: CULTURE: NO GROWTH

## 2015-10-25 ENCOUNTER — Encounter
Admission: RE | Disposition: A | Discharge status: Home / Self Care | Payer: Self-pay | Source: Ambulatory Visit | Attending: Orthopedic Surgery

## 2015-10-25 ENCOUNTER — Encounter: Payer: Self-pay | Admitting: *Deleted

## 2015-10-25 ENCOUNTER — Inpatient Hospital Stay: Payer: BLUE CROSS/BLUE SHIELD

## 2015-10-25 ENCOUNTER — Inpatient Hospital Stay
Admission: RE | Admit: 2015-10-25 | Discharge: 2015-10-27 | DRG: 470 | Disposition: A | Discharge status: Home / Self Care | Payer: BLUE CROSS/BLUE SHIELD | Source: Ambulatory Visit | Attending: Orthopedic Surgery | Admitting: Orthopedic Surgery

## 2015-10-25 ENCOUNTER — Inpatient Hospital Stay: Payer: BLUE CROSS/BLUE SHIELD | Admitting: Anesthesiology

## 2015-10-25 DIAGNOSIS — Z87891 Personal history of nicotine dependence: Secondary | ICD-10-CM | POA: Diagnosis not present

## 2015-10-25 DIAGNOSIS — I10 Essential (primary) hypertension: Secondary | ICD-10-CM | POA: Diagnosis present

## 2015-10-25 DIAGNOSIS — G4730 Sleep apnea, unspecified: Secondary | ICD-10-CM | POA: Diagnosis present

## 2015-10-25 DIAGNOSIS — K219 Gastro-esophageal reflux disease without esophagitis: Secondary | ICD-10-CM | POA: Diagnosis present

## 2015-10-25 DIAGNOSIS — M1712 Unilateral primary osteoarthritis, left knee: Principal | ICD-10-CM | POA: Diagnosis present

## 2015-10-25 DIAGNOSIS — I209 Angina pectoris, unspecified: Secondary | ICD-10-CM | POA: Diagnosis present

## 2015-10-25 DIAGNOSIS — Z96659 Presence of unspecified artificial knee joint: Secondary | ICD-10-CM

## 2015-10-25 HISTORY — PX: KNEE ARTHROPLASTY: SHX992

## 2015-10-25 LAB — TYPE AND SCREEN
ABO/RH(D): B NEG
Antibody Screen: NEGATIVE

## 2015-10-25 SURGERY — ARTHROPLASTY, KNEE, TOTAL, USING IMAGELESS COMPUTER-ASSISTED NAVIGATION
Anesthesia: Spinal | Site: Knee | Laterality: Left | Wound class: Clean

## 2015-10-25 MED ORDER — VITAMIN C 500 MG PO TABS
500.0000 mg | ORAL_TABLET | Freq: Every day | ORAL | Status: DC
  Administered 2015-10-26 – 2015-10-27 (×2): 500 mg via ORAL
  Filled 2015-10-25 (×2): qty 1

## 2015-10-25 MED ORDER — ACETAMINOPHEN 10 MG/ML IV SOLN
1000.0000 mg | Freq: Four times a day (QID) | INTRAVENOUS | Status: DC
  Filled 2015-10-25 (×3): qty 100

## 2015-10-25 MED ORDER — OXYCODONE HCL 5 MG PO TABS
5.0000 mg | ORAL_TABLET | ORAL | Status: DC | PRN
  Administered 2015-10-25: 10 mg via ORAL
  Administered 2015-10-25: 5 mg via ORAL
  Administered 2015-10-26 – 2015-10-27 (×4): 10 mg via ORAL
  Filled 2015-10-25 (×2): qty 2
  Filled 2015-10-25: qty 1
  Filled 2015-10-25 (×3): qty 2
  Filled 2015-10-25: qty 1

## 2015-10-25 MED ORDER — FENTANYL CITRATE (PF) 100 MCG/2ML IJ SOLN: 25.0000 ug | INTRAMUSCULAR | Status: DC | PRN

## 2015-10-25 MED ORDER — METAXALONE 800 MG PO TABS
800.0000 mg | ORAL_TABLET | Freq: Three times a day (TID) | ORAL | Status: DC | PRN
  Administered 2015-10-25 – 2015-10-26 (×2): 800 mg via ORAL
  Filled 2015-10-25 (×2): qty 1

## 2015-10-25 MED ORDER — LACTATED RINGERS IV SOLN
INTRAVENOUS | Status: DC
  Administered 2015-10-25 (×2): via INTRAVENOUS

## 2015-10-25 MED ORDER — VITAMIN D 1000 UNITS PO TABS
2000.0000 [IU] | ORAL_TABLET | Freq: Every day | ORAL | Status: DC
  Administered 2015-10-26 – 2015-10-27 (×2): 2000 [IU] via ORAL
  Filled 2015-10-25 (×2): qty 2

## 2015-10-25 MED ORDER — BUPIVACAINE LIPOSOME 1.3 % IJ SUSP
INTRAMUSCULAR | Status: DC | PRN
  Administered 2015-10-25: 60 mL

## 2015-10-25 MED ORDER — ACETAMINOPHEN 10 MG/ML IV SOLN
1000.0000 mg | Freq: Four times a day (QID) | INTRAVENOUS | Status: AC
  Administered 2015-10-25 – 2015-10-26 (×4): 1000 mg via INTRAVENOUS
  Filled 2015-10-25 (×4): qty 100

## 2015-10-25 MED ORDER — TETRACAINE HCL 1 % IJ SOLN
INTRAMUSCULAR | Status: AC
  Filled 2015-10-25: qty 2

## 2015-10-25 MED ORDER — ACETAMINOPHEN 10 MG/ML IV SOLN
INTRAVENOUS | Status: AC
  Filled 2015-10-25: qty 100

## 2015-10-25 MED ORDER — ACETAMINOPHEN 10 MG/ML IV SOLN
INTRAVENOUS | Status: DC | PRN
  Administered 2015-10-25: 1000 mg via INTRAVENOUS

## 2015-10-25 MED ORDER — FLEET ENEMA 7-19 GM/118ML RE ENEM: 1.0000 | ENEMA | Freq: Once | RECTAL | Status: DC | PRN

## 2015-10-25 MED ORDER — PROPOFOL 500 MG/50ML IV EMUL
INTRAVENOUS | Status: DC | PRN
  Administered 2015-10-25: 50 ug/kg/min via INTRAVENOUS
  Administered 2015-10-25 (×2): via INTRAVENOUS

## 2015-10-25 MED ORDER — SODIUM CHLORIDE 0.9 % IJ SOLN
INTRAMUSCULAR | Status: AC
  Filled 2015-10-25: qty 50

## 2015-10-25 MED ORDER — BUPIVACAINE HCL (PF) 0.5 % IJ SOLN
INTRAMUSCULAR | Status: DC | PRN
  Administered 2015-10-25: 2 mL via INTRATHECAL

## 2015-10-25 MED ORDER — CALCIUM CARBONATE-VITAMIN D 500-200 MG-UNIT PO TABS
1.0000 | ORAL_TABLET | Freq: Every day | ORAL | Status: DC
  Administered 2015-10-25 – 2015-10-27 (×3): 1 via ORAL
  Filled 2015-10-25 (×3): qty 1

## 2015-10-25 MED ORDER — MIDAZOLAM HCL 5 MG/5ML IJ SOLN
INTRAMUSCULAR | Status: DC | PRN
  Administered 2015-10-25: 2 mg via INTRAVENOUS

## 2015-10-25 MED ORDER — TRANEXAMIC ACID 1000 MG/10ML IV SOLN
1000.0000 mg | INTRAVENOUS | Status: AC
  Administered 2015-10-25: 1000 mg via INTRAVENOUS
  Filled 2015-10-25: qty 10

## 2015-10-25 MED ORDER — LIDOCAINE HCL 2 % EX GEL
CUTANEOUS | Status: DC | PRN
  Administered 2015-10-25: 1 via TOPICAL

## 2015-10-25 MED ORDER — FENTANYL CITRATE (PF) 100 MCG/2ML IJ SOLN
INTRAMUSCULAR | Status: DC | PRN
  Administered 2015-10-25: 50 ug via INTRAVENOUS

## 2015-10-25 MED ORDER — NEOMYCIN-POLYMYXIN B GU 40-200000 IR SOLN
Status: DC | PRN
  Administered 2015-10-25: 14 mL

## 2015-10-25 MED ORDER — ONDANSETRON HCL 4 MG PO TABS: 4.0000 mg | ORAL_TABLET | Freq: Four times a day (QID) | ORAL | Status: DC | PRN

## 2015-10-25 MED ORDER — TRANEXAMIC ACID 1000 MG/10ML IV SOLN
1000.0000 mg | Freq: Once | INTRAVENOUS | Status: AC
  Administered 2015-10-25: 1000 mg via INTRAVENOUS
  Filled 2015-10-25: qty 10

## 2015-10-25 MED ORDER — BUPIVACAINE-EPINEPHRINE (PF) 0.25% -1:200000 IJ SOLN
INTRAMUSCULAR | Status: AC
Start: 2015-10-25 — End: 2015-10-25
  Filled 2015-10-25: qty 30

## 2015-10-25 MED ORDER — LISINOPRIL 20 MG PO TABS
20.0000 mg | ORAL_TABLET | Freq: Two times a day (BID) | ORAL | Status: DC
  Filled 2015-10-25 (×2): qty 1

## 2015-10-25 MED ORDER — FUROSEMIDE 40 MG PO TABS
40.0000 mg | ORAL_TABLET | Freq: Every day | ORAL | Status: DC
  Administered 2015-10-25: 40 mg via ORAL
  Filled 2015-10-25 (×2): qty 1

## 2015-10-25 MED ORDER — BUPIVACAINE LIPOSOME 1.3 % IJ SUSP
INTRAMUSCULAR | Status: AC
  Filled 2015-10-25: qty 20

## 2015-10-25 MED ORDER — ACETAMINOPHEN 650 MG RE SUPP: 650.0000 mg | Freq: Four times a day (QID) | RECTAL | Status: DC | PRN

## 2015-10-25 MED ORDER — METOCLOPRAMIDE HCL 10 MG PO TABS
10.0000 mg | ORAL_TABLET | Freq: Three times a day (TID) | ORAL | Status: DC
  Administered 2015-10-25 – 2015-10-27 (×7): 10 mg via ORAL
  Filled 2015-10-25 (×7): qty 1

## 2015-10-25 MED ORDER — CEFAZOLIN SODIUM-DEXTROSE 2-3 GM-% IV SOLR
2.0000 g | Freq: Four times a day (QID) | INTRAVENOUS | Status: AC
  Administered 2015-10-25 – 2015-10-26 (×4): 2 g via INTRAVENOUS
  Filled 2015-10-25 (×5): qty 50

## 2015-10-25 MED ORDER — LIDOCAINE HCL (PF) 2 % IJ SOLN
INTRAMUSCULAR | Status: DC | PRN
  Administered 2015-10-25: 50 mg

## 2015-10-25 MED ORDER — SENNOSIDES-DOCUSATE SODIUM 8.6-50 MG PO TABS
1.0000 | ORAL_TABLET | Freq: Two times a day (BID) | ORAL | Status: DC
Start: 2015-10-25 — End: 2015-10-27
  Administered 2015-10-25 – 2015-10-27 (×4): 1 via ORAL
  Filled 2015-10-25 (×5): qty 1

## 2015-10-25 MED ORDER — PROPOFOL 10 MG/ML IV BOLUS
INTRAVENOUS | Status: DC | PRN
  Administered 2015-10-25: 20 mg via INTRAVENOUS

## 2015-10-25 MED ORDER — PHENOL 1.4 % MT LIQD: 1.0000 | OROMUCOSAL | Status: DC | PRN

## 2015-10-25 MED ORDER — ALUM & MAG HYDROXIDE-SIMETH 200-200-20 MG/5ML PO SUSP: 30.0000 mL | ORAL | Status: DC | PRN

## 2015-10-25 MED ORDER — ONDANSETRON HCL 4 MG/2ML IJ SOLN
4.0000 mg | Freq: Four times a day (QID) | INTRAMUSCULAR | Status: DC | PRN
  Administered 2015-10-25: 4 mg via INTRAVENOUS
  Filled 2015-10-25: qty 2

## 2015-10-25 MED ORDER — HYDROCHLOROTHIAZIDE 12.5 MG PO CAPS
12.5000 mg | ORAL_CAPSULE | Freq: Two times a day (BID) | ORAL | Status: DC
  Filled 2015-10-25 (×2): qty 1

## 2015-10-25 MED ORDER — SODIUM CHLORIDE 0.9 % IV BOLUS (SEPSIS)
1000.0000 mL | INTRAVENOUS | Status: AC
  Administered 2015-10-25: 1000 mL via INTRAVENOUS

## 2015-10-25 MED ORDER — CELECOXIB 200 MG PO CAPS
200.0000 mg | ORAL_CAPSULE | Freq: Two times a day (BID) | ORAL | Status: DC
  Administered 2015-10-25 – 2015-10-27 (×4): 200 mg via ORAL
  Filled 2015-10-25 (×4): qty 1

## 2015-10-25 MED ORDER — KETAMINE HCL 10 MG/ML IJ SOLN
INTRAMUSCULAR | Status: DC | PRN
  Administered 2015-10-25: 30 mg via INTRAVENOUS

## 2015-10-25 MED ORDER — BUPIVACAINE-EPINEPHRINE 0.25% -1:200000 IJ SOLN
INTRAMUSCULAR | Status: DC | PRN
  Administered 2015-10-25: 30 mL

## 2015-10-25 MED ORDER — ADULT MULTIVITAMIN W/MINERALS CH
1.0000 | ORAL_TABLET | Freq: Every day | ORAL | Status: DC
  Administered 2015-10-25 – 2015-10-27 (×3): 1 via ORAL
  Filled 2015-10-25 (×3): qty 1

## 2015-10-25 MED ORDER — FLUTICASONE PROPIONATE 50 MCG/ACT NA SUSP
2.0000 | Freq: Every day | NASAL | Status: DC
  Filled 2015-10-25: qty 16

## 2015-10-25 MED ORDER — VITAMIN B-12 1000 MCG PO TABS
1000.0000 ug | ORAL_TABLET | Freq: Every day | ORAL | Status: DC
  Administered 2015-10-26 – 2015-10-27 (×2): 1000 ug via ORAL
  Filled 2015-10-25 (×2): qty 1

## 2015-10-25 MED ORDER — ACETAMINOPHEN 325 MG PO TABS: 650.0000 mg | ORAL_TABLET | Freq: Four times a day (QID) | ORAL | Status: DC | PRN

## 2015-10-25 MED ORDER — SODIUM CHLORIDE 0.9 % IV SOLN
INTRAVENOUS | Status: DC
  Administered 2015-10-25: 15:00:00 via INTRAVENOUS

## 2015-10-25 MED ORDER — ONDANSETRON HCL 4 MG/2ML IJ SOLN: 4.0000 mg | Freq: Once | INTRAMUSCULAR | Status: DC | PRN

## 2015-10-25 MED ORDER — ENOXAPARIN SODIUM 30 MG/0.3ML ~~LOC~~ SOLN
30.0000 mg | Freq: Two times a day (BID) | SUBCUTANEOUS | Status: DC
  Administered 2015-10-26 – 2015-10-27 (×3): 30 mg via SUBCUTANEOUS
  Filled 2015-10-25 (×3): qty 0.3

## 2015-10-25 MED ORDER — SODIUM CHLORIDE 0.9 % IV SOLN: Freq: Once | INTRAVENOUS | Status: DC

## 2015-10-25 MED ORDER — LISINOPRIL-HYDROCHLOROTHIAZIDE 20-12.5 MG PO TABS: 1.0000 | ORAL_TABLET | Freq: Two times a day (BID) | ORAL | Status: DC

## 2015-10-25 MED ORDER — TETRACAINE HCL 1 % IJ SOLN
INTRAMUSCULAR | Status: DC | PRN
  Administered 2015-10-25: 10 mg via INTRASPINAL

## 2015-10-25 MED ORDER — DEXTROSE 5 % IV SOLN
3.0000 g | Freq: Once | INTRAVENOUS | Status: AC
  Administered 2015-10-25: 3 g via INTRAVENOUS
  Filled 2015-10-25: qty 3000

## 2015-10-25 MED ORDER — TRAMADOL HCL 50 MG PO TABS
50.0000 mg | ORAL_TABLET | ORAL | Status: DC | PRN
  Administered 2015-10-26 (×2): 50 mg via ORAL
  Administered 2015-10-26: 100 mg via ORAL
  Administered 2015-10-27: 50 mg via ORAL
  Filled 2015-10-25: qty 2
  Filled 2015-10-25 (×3): qty 1

## 2015-10-25 MED ORDER — DOXAZOSIN MESYLATE 4 MG PO TABS
8.0000 mg | ORAL_TABLET | Freq: Every day | ORAL | Status: DC
  Administered 2015-10-25: 8 mg via ORAL
  Filled 2015-10-25: qty 1

## 2015-10-25 MED ORDER — GLYCOPYRROLATE 0.2 MG/ML IJ SOLN
INTRAMUSCULAR | Status: DC | PRN
  Administered 2015-10-25 (×2): 0.2 mg via INTRAVENOUS

## 2015-10-25 MED ORDER — BISACODYL 10 MG RE SUPP
10.0000 mg | Freq: Every day | RECTAL | Status: DC | PRN
  Administered 2015-10-27: 10 mg via RECTAL
  Filled 2015-10-25: qty 1

## 2015-10-25 MED ORDER — FERROUS SULFATE 325 (65 FE) MG PO TABS
325.0000 mg | ORAL_TABLET | Freq: Two times a day (BID) | ORAL | Status: DC
  Administered 2015-10-25 – 2015-10-27 (×4): 325 mg via ORAL
  Filled 2015-10-25 (×4): qty 1

## 2015-10-25 MED ORDER — MAGNESIUM HYDROXIDE 400 MG/5ML PO SUSP
30.0000 mL | Freq: Every day | ORAL | Status: DC | PRN
  Administered 2015-10-26 (×2): 30 mL via ORAL
  Filled 2015-10-25 (×2): qty 30

## 2015-10-25 MED ORDER — MORPHINE SULFATE (PF) 2 MG/ML IV SOLN
2.0000 mg | INTRAVENOUS | Status: DC | PRN
  Administered 2015-10-25 – 2015-10-26 (×3): 2 mg via INTRAVENOUS
  Filled 2015-10-25 (×3): qty 1

## 2015-10-25 MED ORDER — MENTHOL 3 MG MT LOZG: 1.0000 | LOZENGE | OROMUCOSAL | Status: DC | PRN

## 2015-10-25 MED ORDER — PANTOPRAZOLE SODIUM 40 MG PO TBEC
40.0000 mg | DELAYED_RELEASE_TABLET | Freq: Two times a day (BID) | ORAL | Status: DC
  Administered 2015-10-25 – 2015-10-27 (×4): 40 mg via ORAL
  Filled 2015-10-25 (×4): qty 1

## 2015-10-25 MED ORDER — NEOMYCIN-POLYMYXIN B GU 40-200000 IR SOLN
Status: AC
  Filled 2015-10-25: qty 20

## 2015-10-25 MED ORDER — OMEGA-3-ACID ETHYL ESTERS 1 G PO CAPS
1000.0000 mg | ORAL_CAPSULE | Freq: Two times a day (BID) | ORAL | Status: DC
  Administered 2015-10-25 – 2015-10-27 (×4): 1000 mg via ORAL
  Filled 2015-10-25 (×4): qty 1

## 2015-10-25 SURGICAL SUPPLY — 59 items
AUTOTRANSFUS HAS 1/8 (MISCELLANEOUS) ×3
BATTERY INSTRU NAVIGATION (MISCELLANEOUS) ×12 IMPLANT
BLADE SAW 1 (BLADE) ×3 IMPLANT
BLADE SAW 1/2 (BLADE) ×3 IMPLANT
BONE CEMENT GENTAMICIN (Cement) ×6 IMPLANT
CANISTER SUCT 1200ML W/VALVE (MISCELLANEOUS) ×3 IMPLANT
CANISTER SUCT 3000ML (MISCELLANEOUS) ×6 IMPLANT
CAP KNEE TOTAL 3 SIGMA ×3 IMPLANT
CATH TRAY METER 16FR LF (MISCELLANEOUS) ×3 IMPLANT
CEMENT BONE GENTAMICIN 40 (Cement) ×2 IMPLANT
COOLER POLAR GLACIER W/PUMP (MISCELLANEOUS) ×3 IMPLANT
DRAPE SHEET LG 3/4 BI-LAMINATE (DRAPES) ×3 IMPLANT
DRSG DERMACEA 8X12 NADH (GAUZE/BANDAGES/DRESSINGS) ×3 IMPLANT
DRSG OPSITE POSTOP 4X14 (GAUZE/BANDAGES/DRESSINGS) ×3 IMPLANT
DRSG TEGADERM 4X4.75 (GAUZE/BANDAGES/DRESSINGS) IMPLANT
DURAPREP 26ML APPLICATOR (WOUND CARE) ×3 IMPLANT
ELECT CAUTERY BLADE 6.4 (BLADE) ×3 IMPLANT
ELECT REM PT RETURN 9FT ADLT (ELECTROSURGICAL) ×3
ELECTRODE REM PT RTRN 9FT ADLT (ELECTROSURGICAL) ×1 IMPLANT
EX-PIN ORTHOLOCK NAV 4X150 (PIN) ×6 IMPLANT
GLOVE BIOGEL M STRL SZ7.5 (GLOVE) ×6 IMPLANT
GLOVE INDICATOR 8.0 STRL GRN (GLOVE) ×3 IMPLANT
GLOVE SURG 9.0 ORTHO LTXF (GLOVE) ×3 IMPLANT
GLOVE SURG ORTHO 9.0 STRL STRW (GLOVE) ×3 IMPLANT
GOWN STRL REUS W/ TWL LRG LVL3 (GOWN DISPOSABLE) ×2 IMPLANT
GOWN STRL REUS W/ TWL LRG LVL4 (GOWN DISPOSABLE) ×1 IMPLANT
GOWN STRL REUS W/TWL 2XL LVL3 (GOWN DISPOSABLE) ×3 IMPLANT
GOWN STRL REUS W/TWL LRG LVL3 (GOWN DISPOSABLE) ×4
GOWN STRL REUS W/TWL LRG LVL4 (GOWN DISPOSABLE) ×2
HANDPIECE SUCTION TUBG SURGILV (MISCELLANEOUS) ×6 IMPLANT
HOLDER FOLEY CATH W/STRAP (MISCELLANEOUS) ×3 IMPLANT
HOOD PEEL AWAY FLYTE STAYCOOL (MISCELLANEOUS) ×6 IMPLANT
KIT RM TURNOVER STRD PROC AR (KITS) ×3 IMPLANT
KNIFE SCULPS 14X20 (INSTRUMENTS) ×3 IMPLANT
NDL SAFETY 18GX1.5 (NEEDLE) ×3 IMPLANT
NEEDLE SPNL 20GX3.5 QUINCKE YW (NEEDLE) ×3 IMPLANT
NS IRRIG 500ML POUR BTL (IV SOLUTION) ×3 IMPLANT
PACK TOTAL KNEE (MISCELLANEOUS) ×3 IMPLANT
PAD WRAPON POLAR KNEE (MISCELLANEOUS) ×1 IMPLANT
PIN FIXATION 1/8DIA X 3INL (PIN) ×3 IMPLANT
SOL .9 NS 3000ML IRR  AL (IV SOLUTION) ×2
SOL .9 NS 3000ML IRR UROMATIC (IV SOLUTION) ×1 IMPLANT
SOL PREP PVP 2OZ (MISCELLANEOUS) ×3
SOLUTION PREP PVP 2OZ (MISCELLANEOUS) ×1 IMPLANT
SPONGE DRAIN TRACH 4X4 STRL 2S (GAUZE/BANDAGES/DRESSINGS) ×3 IMPLANT
SPONGE LAP 18X18 5 PK (GAUZE/BANDAGES/DRESSINGS) ×3 IMPLANT
STAPLER SKIN PROX 35W (STAPLE) ×3 IMPLANT
SUCTION FRAZIER HANDLE 10FR (MISCELLANEOUS) ×4
SUCTION TUBE FRAZIER 10FR DISP (MISCELLANEOUS) ×2 IMPLANT
SUT VIC AB 0 CT1 36 (SUTURE) ×3 IMPLANT
SUT VIC AB 1 CT1 36 (SUTURE) ×6 IMPLANT
SUT VIC AB 2-0 CT2 27 (SUTURE) ×3 IMPLANT
SYR 20CC LL (SYRINGE) ×3 IMPLANT
SYR 30ML LL (SYRINGE) ×3 IMPLANT
SYR 50ML LL SCALE MARK (SYRINGE) ×3 IMPLANT
SYSTEM AUTOTRANSFUS DUAL TROCR (MISCELLANEOUS) ×1 IMPLANT
TOWEL OR 17X26 4PK STRL BLUE (TOWEL DISPOSABLE) ×3 IMPLANT
TOWER CARTRIDGE SMART MIX (DISPOSABLE) ×3 IMPLANT
WRAPON POLAR PAD KNEE (MISCELLANEOUS) ×3

## 2015-10-25 NOTE — Anesthesia Preprocedure Evaluation (Signed)
Anesthesia Evaluation  Patient identified by MRN, date of birth, ID band Patient awake    Reviewed: Allergy & Precautions, NPO status , Patient's Chart, lab work & pertinent test results  Airway Mallampati: II       Dental  (+) Teeth Intact   Pulmonary sleep apnea , former smoker,     + decreased breath sounds      Cardiovascular hypertension, Pt. on medications + angina  Rhythm:Regular     Neuro/Psych    GI/Hepatic Neg liver ROS, GERD  ,  Endo/Other  negative endocrine ROS  Renal/GU negative Renal ROS     Musculoskeletal   Abdominal Normal abdominal exam  (+)   Peds  Hematology   Anesthesia Other Findings   Reproductive/Obstetrics                             Anesthesia Physical Anesthesia Plan  ASA: III  Anesthesia Plan: Spinal   Post-op Pain Management:    Induction: Intravenous  Airway Management Planned: Natural Airway and Nasal Cannula  Additional Equipment:   Intra-op Plan:   Post-operative Plan:   Informed Consent: I have reviewed the patients History and Physical, chart, labs and discussed the procedure including the risks, benefits and alternatives for the proposed anesthesia with the patient or authorized representative who has indicated his/her understanding and acceptance.     Plan Discussed with: CRNA  Anesthesia Plan Comments:         Anesthesia Quick Evaluation

## 2015-10-25 NOTE — Progress Notes (Signed)
Type and Screen drawn by lab tech preop

## 2015-10-25 NOTE — Op Note (Signed)
OPERATIVE NOTE  DATE OF SURGERY:  10/25/2015  PATIENT NAME:  [name]   DOB: [id number]  MRN: [id number]  PRE-OPERATIVE DIAGNOSIS: Degenerative arthrosis of the left knee, primary  POST-OPERATIVE DIAGNOSIS:  Same  PROCEDURE:  Left total knee arthroplasty using computer-assisted navigation  SURGEON:  Marciano Sequin. M.D.  ASSISTANT:  Vance Peper, PA (present and scrubbed throughout the case, critical for assistance with exposure, retraction, instrumentation, and closure)  ANESTHESIA: spinal  ESTIMATED BLOOD LOSS: 100 mL  FLUIDS REPLACED: 1900 mL of crystalloid  TOURNIQUET TIME: 131 minutes  DRAINS: 2 medium drains to a reinfusion system  SOFT TISSUE RELEASES: Anterior cruciate ligament, posterior cruciate ligament, deep and superficial medial collateral ligament, patellofemoral ligament   IMPLANTS UTILIZED: DePuy PFC Sigma size 5 posterior stabilized femoral component (cemented), size 5 MBT tibial component (cemented), 41 mm 3 peg oval dome patella (cemented), and a 10 mm stabilized rotating platform polyethylene insert.  INDICATIONS FOR SURGERY: [name] is a 65 y.o. year old male with a long history of progressive knee pain. X-rays demonstrated severe degenerative changes in tricompartmental fashion. The patient had not seen any significant improvement despite conservative nonsurgical intervention. After discussion of the risks and benefits of surgical intervention, the patient expressed understanding of the risks benefits and agree with plans for total knee arthroplasty.   The risks, benefits, and alternatives were discussed at length including but not limited to the risks of infection, bleeding, nerve injury, stiffness, blood clots, the need for revision surgery, cardiopulmonary complications, among others, and they were willing to proceed.  PROCEDURE IN DETAIL: The patient was brought into the operating room and, after adequate spinal anesthesia was achieved, a  tourniquet was placed on the patient's upper thigh. The patient's knee and leg were cleaned and prepped with alcohol and DuraPrep and draped in the usual sterile fashion. A "timeout" was performed as per usual protocol. The lower extremity was exsanguinated using an Esmarch, and the tourniquet was inflated to 300 mmHg. An anterior longitudinal incision was made followed by a standard mid vastus approach. The deep fibers of the medial collateral ligament were elevated in a subperiosteal fashion off of the medial flare of the tibia so as to maintain a continuous soft tissue sleeve. The patella was subluxed laterally and the patellofemoral ligament was incised. Inspection of the knee demonstrated severe degenerative changes with full-thickness loss of articular cartilage. Osteophytes were debrided using a rongeur. Anterior and posterior cruciate ligaments were excised. Two 4.0 mm Schanz pins were inserted in the femur and into the tibia for attachment of the array of trackers used for computer-assisted navigation. Hip center was identified using a circumduction technique. Distal landmarks were mapped using the computer. The distal femur and proximal tibia were mapped using the computer. The distal femoral cutting guide was positioned using computer-assisted navigation so as to achieve a 5 distal valgus cut. The femur was sized and it was felt that a size 5 femoral component was appropriate. A size 5 femoral cutting guide was positioned and the anterior cut was performed and verified using the computer. This was followed by completion of the posterior and chamfer cuts. Femoral cutting guide for the central box was then positioned in the center box cut was performed.  Attention was then directed to the proximal tibia. Medial and lateral menisci were excised. The extramedullary tibial cutting guide was positioned using computer-assisted navigation so as to achieve a 0 varus-valgus alignment and 0 posterior slope. The  cut was performed  and verified using the computer. The proximal tibia was sized and it was felt that a size 5 tibial tray was appropriate. A prominent posterior osteophytes and osteochondral loose bodies were removed from the posterior recess. Tibial and femoral trials were inserted followed by insertion of a 10 mm polyethylene insert. The knee was felt to be tight medially. A Cobb elevator was used to elevate the superficial fibers of the medial collateral ligament. This allowed for excellent mediolateral soft tissue balancing both in flexion and in full extension. Finally, the patella was cut and prepared so as to accommodate a 41 mm 3 peg oval dome patella. A patella trial was placed and the knee was placed through a range of motion with excellent patellar tracking appreciated. The femoral trial was removed after debridement of posterior osteophytes. The central post-hole for the tibial component was reamed followed by insertion of a keel punch. Tibial trials were then removed. Cut surfaces of bone were irrigated with copious amounts of normal saline with antibiotic solution using pulsatile lavage and then suctioned dry. Polymethylmethacrylate cement with gentamicin was prepared in the usual fashion using a vacuum mixer. Cement was applied to the cut surface of the proximal tibia as well as along the undersurface of a size 5 MBT tibial component. Tibial component was positioned and impacted into place. Excess cement was removed using [employer] fast streamer. Cement was then applied to the cut surfaces of the femur as well as along the posterior flanges of the size 5 femoral component. The femoral component was positioned and impacted into place. Excess cement was removed using [employer] fast streamer. A 10 mm polyethylene trial was inserted and the knee was brought into full extension with steady axial compression applied. Finally, cement was applied to the backside of a 41 mm 3 peg oval dome patella and the patellar component  was positioned and patellar clamp applied. Excess cement was removed using [employer] fast streamer. After adequate curing of the cement, the tourniquet was deflated after a total tourniquet time of 131 minutes. Hemostasis was achieved using electrocautery. The knee was irrigated with copious amounts of normal saline with antibiotic solution using pulsatile lavage and then suctioned dry. 20 mL of 1.3% Exparel in 40 mL of normal saline was injected along the posterior capsule, medial and lateral gutters, and along the arthrotomy site. A 10 mm stabilized rotating platform polyethylene insert was inserted and the knee was placed through a range of motion with excellent mediolateral soft tissue balancing appreciated and excellent patellar tracking noted. 2 medium drains were placed in the wound bed and brought out through separate stab incisions to be attached to a reinfusion system. The medial parapatellar portion of the incision was reapproximated using interrupted sutures of #1 Vicryl. Subcutaneous tissue was then injected with a total of 30 cc of 0.25% Marcaine with epinephrine. Subcutaneous tissue was approximated in layers using first #0 Vicryl followed #2-0 Vicryl. The skin was approximated with skin staples. A sterile dressing was applied.  The patient tolerated the procedure well and was transported to the recovery room in stable condition.    James P. Holley Bouche., M.D.

## 2015-10-25 NOTE — Care Management Note (Addendum)
Case Management Note  Patient Details  Name: [name] MRN: [id number] Date of Birth: [birth year]  Subjective/Objective:                  Patient received today from PACU; PT evaluation pending. Met with patient, his wife and one of his daughters to discuss discharge planning. He plans to return home and have HHPT through [facility]. He thinks he has a rolling walker which was obtained less than 5 years ago. He states she has an elevated toilet. He uses [facility] [place] for Rx  [phone]. Action/Plan: List of home health agencies left with patient. Referral called to [facility]. RNCM to follow for rolling walker need. [facility] will [facility] to follow.   Expected Discharge Date:                  Expected Discharge Plan:     In-House Referral:     Discharge planning Services  CM Consult  Post Acute Care Choice:  Home Health Choice offered to:  Patient  DME Arranged:    DME Agency:     [place]  [facility] Status of Service:  In process, will continue to follow  Medicare Important Message Given:    Date Medicare IM Given:    Medicare IM give by:    Date Additional Medicare IM Given:    Additional Medicare Important Message give by:     If discussed at [place] Meet[place], dates discussed:    Additional Comments: Lovenox $15.00. Patient has brought rolling walker for PT to evaluation.   AMarshell Garfinkel RN 10/25/2015, 2:53 PM

## 2015-10-25 NOTE — Progress Notes (Signed)
TXA and Ancef 3 gm sent to OR with patient

## 2015-10-25 NOTE — Transfer of Care (Signed)
Immediate Anesthesia Transfer of Care Note  Patient: [name]  Procedure(s) Performed: Procedure(s): COMPUTER ASSISTED TOTAL KNEE ARTHROPLASTY (Left)  Patient Location: PACU  Anesthesia Type:Spinal  Level of Consciousness: awake  Airway & Oxygen Therapy: Patient Spontanous Breathing and Patient connected to face mask oxygen  Post-op Assessment: Report given to RN  Post vital signs: Reviewed  Last Vitals:  Filed Vitals:   10/25/15 0557 10/25/15 1145  BP: 119/71 103/60  Pulse: 51 64  Temp: 36.6 C 36.9 C  Resp: 16 16    Complications: No apparent anesthesia complications

## 2015-10-25 NOTE — Progress Notes (Signed)
To OR with thermal cap in place, thigh high TED on non-op/right leg - other stkg on chart Sacral dressing sent to OR with patient

## 2015-10-25 NOTE — Brief Op Note (Signed)
10/25/2015  11:47 AM  PATIENT:  [name]  65 y.o. male  PRE-OPERATIVE DIAGNOSIS:  PRIMARY OSTEOARTHRITIS of the left knee  POST-OPERATIVE DIAGNOSIS:  PRIMARY OSTEOARTHRITIS of the left knee  PROCEDURE:  Procedure(s): COMPUTER ASSISTED TOTAL KNEE ARTHROPLASTY (Left)  SURGEON:  Surgeon(s) and Role:    * Dereck Leep, MD - Primary  ASSISTANTS: Vance Peper, PA   ANESTHESIA:   spinal  EBL:  Total I/O In: 1900 [I.V.:1900] Out: 1200 [Urine:1100; Blood:100]  BLOOD ADMINISTERED:none  DRAINS: 2 medium drains to a reinfusion system   LOCAL MEDICATIONS USED:  MARCAINE    and OTHER Exparel  SPECIMEN:  No Specimen  DISPOSITION OF SPECIMEN:  N/A  COUNTS:  YES  TOURNIQUET:   131 minutes  DICTATION: .Dragon Dictation  PLAN OF CARE: Admit to inpatient   PATIENT DISPOSITION:  PACU - hemodynamically stable.   Delay start of Pharmacological VTE agent (>24hrs) due to surgical blood loss or risk of bleeding: yes

## 2015-10-25 NOTE — Progress Notes (Signed)
Plan of care discussed with patient. Patient states that he would like for nursing to awake him for pain medication. Pain controlled with PRN medication. Patient complaints of N/V medicated with PRN medication. KPAD applied to upper back and shoulders. Patient demonstrates correct use of incentive spirometer. Dressing CDI. Drains attached. IV fluids infusing.

## 2015-10-25 NOTE — Progress Notes (Signed)
Patients BP 89/50 and heart rate 49, Dr Marry Guan notified. NS Bolus 1036ml once ordered.

## 2015-10-25 NOTE — Progress Notes (Signed)
Per Anesthesia - will do spinal - sacral drsg sent to OR with patient to be applied after spinal

## 2015-10-25 NOTE — Progress Notes (Signed)
PT Cancellation Note  Patient Details Name: [name] MRN: [id number] DOB: Aug 30, 1951   Cancelled Treatment:    Reason Eval/Treat Not Completed: Medical issues which prohibited therapy. Patient reports high levels of pain as well as no return of motor function on LLE and thus is inappropriate for any attempt at mobility currently. PT will follow and re-attempt tomorrow morning.   Kerman Passey, PT, DPT    10/25/2015, 3:52 PM

## 2015-10-25 NOTE — Anesthesia Postprocedure Evaluation (Signed)
Anesthesia Post Note  Patient: [name]  Procedure(s) Performed: Procedure(s) (LRB): COMPUTER ASSISTED TOTAL KNEE ARTHROPLASTY (Left)  Patient location during evaluation: PACU Level of consciousness: awake Pain management: pain level controlled Vital Signs Assessment: post-procedure vital signs reviewed and stable Respiratory status: spontaneous breathing Cardiovascular status: stable Anesthetic complications: no    Last Vitals:  Filed Vitals:   10/25/15 1145 10/25/15 1146  BP: 103/60 103/60  Pulse: 64 52  Temp: 36.9 C 36.9 C  Resp: 16 16    Last Pain:  Filed Vitals:   10/25/15 1206  PainSc: 0-No pain                 VAN STAVEREN,Danthony Kendrix

## 2015-10-25 NOTE — H&P (Signed)
The patient has been re-examined, and the chart reviewed, and there have been no interval changes to the documented history and physical.    The risks, benefits, and alternatives have been discussed at length. The patient expressed understanding of the risks benefits and agreed with plans for surgical intervention.  James P. Hooten, Jr. M.D.    

## 2015-10-25 NOTE — Addendum Note (Signed)
Addendum  created 10/25/15 1244 by Rolla Plate, CRNA   Modules edited: Anesthesia Flowsheet

## 2015-10-25 NOTE — Anesthesia Procedure Notes (Signed)
Spinal Patient location during procedure: OR Start time: 10/25/2015 7:15 AM End time: 10/25/2015 7:25 AM Staffing Anesthesiologist: VAN STAVEREN, GIJSBERTUS F Resident/CRNA: LOGAN, BENJAMIN Performed by: resident/CRNA  Preanesthetic Checklist Completed: patient identified, site marked, surgical consent, pre-op evaluation, timeout performed, IV checked, risks and benefits discussed and monitors and equipment checked Spinal Block Patient position: sitting Prep: ChloraPrep and site prepped and draped Patient monitoring: heart rate, continuous pulse ox, blood pressure and cardiac monitor Approach: midline Location: L4-5 Injection technique: single-shot Needle Needle type: Whitacre and Introducer  Needle gauge: 25 G Needle length: 12.7 cm Additional Notes Negative paresthesia. Negative blood return. Positive free-flowing CSF. Expiration date of kit checked and confirmed. Patient tolerated procedure well, without complications.     

## 2015-10-25 NOTE — Progress Notes (Signed)
Spouse advises c-pap in car and she will bring it in later

## 2015-10-26 LAB — CBC
HEMATOCRIT: 35.4 % — AB (ref 40.0–52.0)
HEMOGLOBIN: 12.5 g/dL — AB (ref 13.0–18.0)
MCH: 30.3 pg (ref 26.0–34.0)
MCHC: 35.3 g/dL (ref 32.0–36.0)
MCV: 86 fL (ref 80.0–100.0)
Platelets: 137 10*3/uL — ABNORMAL LOW (ref 150–440)
RBC: 4.12 MIL/uL — AB (ref 4.40–5.90)
RDW: 13 % (ref 11.5–14.5)
WBC: 8.8 10*3/uL (ref 3.8–10.6)

## 2015-10-26 LAB — BASIC METABOLIC PANEL
Anion gap: 5 (ref 5–15)
BUN: 14 mg/dL (ref 6–20)
CHLORIDE: 104 mmol/L (ref 101–111)
CO2: 28 mmol/L (ref 22–32)
Calcium: 8.5 mg/dL — ABNORMAL LOW (ref 8.9–10.3)
Creatinine, Ser: 1.03 mg/dL (ref 0.61–1.24)
GFR calc Af Amer: >60 mL/min (ref >60)
GFR calc non Af Amer: >60 mL/min (ref >60)
GLUCOSE: 120 mg/dL — AB (ref 65–99)
POTASSIUM: 3.8 mmol/L (ref 3.5–5.1)
Sodium: 137 mmol/L (ref 135–145)

## 2015-10-26 MED ORDER — LACTULOSE 10 GM/15ML PO SOLN: 20.0000 g | Freq: Two times a day (BID) | ORAL | Status: DC | PRN

## 2015-10-26 NOTE — Progress Notes (Signed)
Physical Therapy Treatment Patient Details Name: [name] MRN: [id number] DOB: December 06, 1950 Today's Date: 10/26/2015    History of Present Illness Pt underwent L TKR without reported post-op complications. Attempted PT evaluation POD#0 but pt had not recovered full sensation and motor function. PT evaluation performed on POD#1. Pt denies falls in the last 12 months    PT Comments    Pt demonstrates improving ambulation during PM session. He reports well managed pain following AM session and is able to complete a full lap around RN station. Pt able to complete seated exercises and L knee AAROM appears to be grossly improving. Transfers and bed mobility improving. Pt able to complete full lap around RN station. Will attempt stairs tomorrow AM in anticipation of PM discharge if patient able to have BM and meets all medical criteria. Pt will benefit from skilled PT services to address deficits in strength, balance, and mobility in order to return to full function at home.    Follow Up Recommendations  Home health PT     Equipment Recommendations  None recommended by PT    Recommendations for Other Services       Precautions / Restrictions Precautions Precautions: Fall Restrictions Weight Bearing Restrictions: Yes LLE Weight Bearing: Weight bearing as tolerated    Mobility  Bed Mobility Overal bed mobility: Needs Assistance Bed Mobility: Sit to Supine       Sit to supine: Supervision   General bed mobility comments: Pt demonstrats good L hip flexion upon return to bed. Pt educated about how to utilize RLE to assist returning LLE to bed. Able to scoot up toward [facility] independently  Transfers Overall transfer level: Needs assistance Equipment used: Rolling walker (2 wheeled) Transfers: Sit to/from Stand Sit to Stand: Min guard         General transfer comment: Pt demonstrates decreased weight shift to LLE but overall imrpoving hand placement and speed. Pt still limits L  knee flexion  Ambulation/Gait Ambulation/Gait assistance: Min guard Ambulation Distance (Feet): 220 Feet Assistive device: Rolling walker (2 wheeled) Gait Pattern/deviations: Step-to pattern Gait velocity: Decreased Gait velocity interpretation: <1.8 ft/sec, indicative of risk for recurrent falls General Gait Details: Pt progresses to partial step-through pattern with cues. Decreased weight shift initially to LLE with cues from therapist to decrease UE relaince and improve upright posture. Pt cued to equalize step length and able to progress to partial step-through pattern   Stairs            Wheelchair Mobility    Modified Rankin (Stroke Patients Only)       Balance Overall balance assessment: Needs assistance Sitting-balance support: No upper extremity supported Sitting balance-Leahy Scale: Good     Standing balance support: No upper extremity supported Standing balance-Leahy Scale: Fair                      Cognition Arousal/Alertness: Awake/alert Behavior During Therapy: WFL for tasks assessed/performed Overall Cognitive Status: Within Functional Limits for tasks assessed                      Exercises Total Joint Exercises Ankle Circles/Pumps: Strengthening;Both;10 reps;Seated (Heel raises) Hip ABduction/ADduction: Strengthening;10 reps;Both;Seated Long Arc Quad: Strengthening;Left;10 reps;Seated Knee Flexion: Strengthening;Left;10 reps;Seated Marching in Standing: Strengthening;Both;10 reps;Seated    General Comments        Pertinent Vitals/Pain Pain Assessment: 0-10 Pain Score: 2  Pain Location: L knee, increases to 4/10 with ambulation Pain Descriptors / Indicators: Burning Pain Intervention(s):  Premedicated before session;Monitored during session    [place] expects to be discharged to:: Private residence Living Arrangements: Spouse/significant other Available Help at Discharge: Family Type of Home:  House Home Access: Stairs to enter Entrance Stairs-Rails: None Home Layout: Multi-level;Able to live on main level with bedroom/bathroom Home Equipment: Gilford Rile - 2 wheels;Bedside commode;Shower seat;Grab bars - tub/shower;Grab bars - toilet      Prior Function Level of Independence: Independent          PT Goals (current goals can now be found in the care plan section) Acute Rehab PT Goals Patient Stated Goal: To go home PT Goal Formulation: With patient/family Time For Goal Achievement: 11/09/15 Potential to Achieve Goals: Good Progress towards PT goals: Progressing toward goals    Frequency  BID    PT Plan Current plan remains appropriate    Co-evaluation             End of Session Equipment Utilized During Treatment: Gait belt Activity Tolerance: Patient tolerated treatment well Patient left: with call bell/phone within reach;with family/visitor present;in bed;with bed alarm set;with SCD's reapplied (polar care in place, towel roll under heel)     Time: [id number] PT Time Calculation (min) (ACUTE ONLY): 28 min  Charges:  $Gait Training: 8-22 mins $Therapeutic Exercise: 8-22 mins                    G Codes:      [name] PT, DPT   [name] 10/26/2015, 3:07 PM

## 2015-10-26 NOTE — Progress Notes (Signed)
   Subjective: 1 Day Post-Op Procedure(s) (LRB): COMPUTER ASSISTED TOTAL KNEE ARTHROPLASTY (Left) Patient reports pain as 2 on 0-10 scale.   Patient is well, and has had no acute complaints or problems We will start therapy today.  Plan is to go Home after hospital stay. no nausea and no vomiting Patient denies any chest pains or shortness of breath. Patient rested off and on during the night. Using CPAP. Otherwise no significant complaints.  Objective: Vital signs in last 24 hours: Temp:  [96.5 F (35.8 C)-99.4 F (37.4 C)] 97.9 F (36.6 C) (02/07 0437) Pulse Rate:  [37-64] 61 (02/07 0437) Resp:  [16-20] 19 (02/07 0437) BP: (89-130)/(50-99) 125/62 mmHg (02/07 0437) SpO2:  [97 %-100 %] 98 % (02/07 0437) Patient has original dressing on and unable to evaluate the wound at this time. Heels are non tender and elevated off the bed using rolled towels Intake/Output from previous day: 02/06 0701 - 02/07 0700 In: 4673.7 [P.O.:960; I.V.:3326.7; IV Piggyback:200] Out: 5195 [Urine:4575; Drains:520; Blood:100] Intake/Output this shift: Total I/O In: 1766.7 [P.O.:240; I.V.:1326.7; IV Piggyback:200] Out: 2895 [Urine:2575; Drains:320]   Recent Labs  10/26/15 0457  HGB 12.5*    Recent Labs  10/26/15 0457  WBC 8.8  RBC 4.12*  HCT 35.4*  PLT 137*    Recent Labs  10/26/15 0457  NA 137  K 3.8  CL 104  CO2 28  BUN 14  CREATININE 1.03  GLUCOSE 120*  CALCIUM 8.5*   No results for input(s): LABPT, INR in the last 72 hours.  EXAM General - Patient is Alert, Appropriate and Oriented Extremity - Neurologically intact Neurovascular intact Sensation intact distally Intact pulses distally Dorsiflexion/Plantar flexion intact Dressing - dressing C/D/I Motor Function - intact, moving foot and toes well on exam. Patient is able to do straight leg raise on his own.  Past Medical History  Diagnosis Date  . GERD (gastroesophageal reflux disease)   . Anginal pain ([place])   .  Hypertension   . Sleep apnea   . Obesity   . Erectile dysfunction   . SOB (shortness of breath)   . BPH (benign prostatic hyperplasia)   . Osteoarthritis     first metacarpal of left hand  . Lumbosacral radiculitis   . Adenomatous polyp of colon   . Diverticulosis   . Complication of anesthesia     spinal anesthesia did not work for right TKR ended up with general    Assessment/Plan: 1 Day Post-Op Procedure(s) (LRB): COMPUTER ASSISTED TOTAL KNEE ARTHROPLASTY (Left) Active Problems:   Left knee DJD   S/P total knee arthroplasty  Estimated body mass index is 39.77 kg/(m^2) as calculated from the following:   Height as of this encounter: 5\' 11"  (1.803 m).   Weight as of this encounter: 129.275 kg (285 lb). Advance diet Up with therapy D/C IV fluids Plan for discharge tomorrow Discharge home with home health  Labs: Were reviewed DVT Prophylaxis - Lovenox, Foot Pumps and TED hose Weight-Bearing as tolerated to left leg D/C O2 and Pulse OX and try on Room Air Begin working on a bowel movement Labs in a.m. Patient needs to walk at least halfway around the nurse's desk this today  Jillyn Ledger. [place] 10/26/2015, 6:55 AM

## 2015-10-26 NOTE — Care Management Important Message (Signed)
Important Message  Patient Details  Name: [name] MRN: [id number] Date of Birth: Dec 03, 1950   Medicare Important Message Given:  Yes    Beau Fanny, RN 10/26/2015, 9:59 AM

## 2015-10-26 NOTE — Progress Notes (Signed)
Clinical Social Worker (CSW) received SNF consult. PT is recommending home health. RN Case Manager is aware of above. Please reconsult if future social work needs arise. CSW signing off.   Shadonna Benedick Morgan, LCSW [phone] 

## 2015-10-26 NOTE — Progress Notes (Signed)
Patient dangled tolerated well.

## 2015-10-26 NOTE — Evaluation (Signed)
Occupational Therapy Evaluation Patient Details Name: [name] MRN: [id number] DOB: Jul 07, 1951 Today's Date: 10/26/2015    History of Present Illness Pt underwent L TKR without reported post-op complications.   Clinical Impression   This patient is a 65 year old male who came to [facility] for a L total knee replacement. He had his right knee replaced ~ 4 years ago.  Patient lives with his wife in a home.  He had been independent with ADL and functional mobility. He now has some deficits (mild) with pain, mobility and activities of daily living and would benefit from Occupational Therapy for ADL/functional mobility training.      Follow Up Recommendations   (Home with home health Physical Therapy only, no further Occupational Therapy needed after discharge)    Equipment Recommendations       Recommendations for Other Services       Precautions / Restrictions Precautions Precautions: Fall Restrictions Weight Bearing Restrictions: Yes LLE Weight Bearing: Weight bearing as tolerated      Mobility Bed Mobility  Transfers Overall transfer level: Needs assistance Equipment used: Rolling walker (2 wheeled) Transfers: Sit to/from Stand Sit to Stand: Min guard, observed at the end of Physical Therapy.                 ADL                                         General ADL Comments: Patient had been independent with all ADL.  Today practiced techniques for lower body dressing using hip kit. Patient Donned/doffed socks and pants to knees (drain still in place) with set up and verbal cues for technique and safety. Written list of vendors provided but did instruct the patient that he will most likely not need hip kit in a few days.      Vision     Perception     Praxis      Pertinent Vitals/Pain Pain Assessment: 0-10 Pain Score: 2  Pain Location: L knee Pain Descriptors / Indicators: Burning Pain Intervention(s):  Premedicated before session;Monitored during session;Limited activity within patient's tolerance     Hand Dominance Right   Extremity/Trunk Assessment Upper Extremity Assessment Upper Extremity Assessment: Overall WFL for tasks assessed   Lower Extremity Assessment Lower Extremity Assessment: Defer to PT evaluation LLE Deficits / Details: RLE grossly WFL. LLE: Pt able to perform full SLR and SAQ without assistance. Full DF/PF. Pt reports return of full sensation in LLE       Communication Communication Communication: No difficulties   Cognition Arousal/Alertness: Awake/alert Behavior During Therapy: WFL for tasks assessed/performed Overall Cognitive Status: Within Functional Limits for tasks assessed                     General Comments       Exercises Exercises: Total Joint     Shoulder Instructions      Home Living Family/patient expects to be discharged to:: Private residence Living Arrangements: Spouse/significant other Available Help at Discharge: Family Type of Home: House Home Access: Stairs to enter [employer] of Steps: 1 Entrance Stairs-Rails: None Home Layout: Multi-level;Able to live on main level with bedroom/bathroom     Bathroom Shower/Tub: Occupational psychologist: Handicapped height Bathroom Accessibility: Yes   Home Equipment: Environmental consultant - 2 wheels;Bedside commode;Shower seat;Grab bars - tub/shower;Grab bars - toilet  Prior Functioning/Environment Level of Independence: Independent        Comments: Fully independent with ADLs/IADLs    OT Diagnosis: Acute pain   OT Problem List: Decreased range of motion;Decreased activity tolerance;Impaired balance (sitting and/or standing);Decreased knowledge of use of DME or AE;Pain   OT Treatment/Interventions: Self-care/ADL training    OT Goals(Current goals can be found in the care plan section) Acute Rehab OT Goals Patient Stated Goal: To go home OT Goal  Formulation: With patient/family Time For Goal Achievement: 11/09/15 Potential to Achieve Goals: Good  OT Frequency: Min 1X/week   Barriers to D/C:            Co-evaluation              End of Session Equipment Utilized During Treatment:  (Hip kit)  Activity Tolerance:   Patient left: in chair;with call bell/phone within reach;with chair alarm set;with family/visitor present   Time: 1005-1026 OT Time Calculation (min): 21 min Charges:  OT General Charges $OT Visit: 1 Procedure OT Evaluation $OT Eval Low Complexity: 1 Procedure OT Treatments $Self Care/Home Management : 8-22 mins G-Codes:    Myrene Galas, MS/OTR/L  10/26/2015, 11:23 AM

## 2015-10-26 NOTE — Evaluation (Signed)
Physical Therapy Evaluation Patient Details Name: [name] MRN: [id number] DOB: 09-27-1950 Today's Date: 10/26/2015   History of Present Illness  Pt underwent L TKR without reported post-op complications. Attempted PT evaluation POD#0 but pt had not recovered full sensation and motor function. PT evaluation performed on POD#1. Pt denies falls in the last 12 months  Clinical Impression  Pt demonstrates excellent mobility with therapy on this date. He requires some cues for safe transfer but overall demonstrates good stability and strength. Pt able to perform SLR and SAQ without assistance. He is able to ambulate to RN station and back to recliner. This afternoon will progress ambulation distance as well as exercises. Will complete stair training tomorrow AM as appropriate. Pt will benefit from skilled PT services to address deficits in strength, balance, and mobility in order to return to full function at home.      Follow Up Recommendations Home health PT    Equipment Recommendations  None recommended by PT    Recommendations for Other Services       Precautions / Restrictions Precautions Precautions: Fall Restrictions Weight Bearing Restrictions: Yes LLE Weight Bearing: Weight bearing as tolerated      Mobility  Bed Mobility Overal bed mobility: Needs Assistance Bed Mobility: Supine to Sit     Supine to sit: Supervision     General bed mobility comments: Pt demonstrates good speed and sequencing with bed mobilty. Excellent L hip flexion and abduction strength when moving to the L side of bed. Limited L knee flexion when sitting upright  Transfers Overall transfer level: Needs assistance Equipment used: Rolling walker (2 wheeled) Transfers: Sit to/from Stand Sit to Stand: Min guard         General transfer comment: Pt requires cues for proper hand placement during transfer. Limited L knee flexion and restricted weight shifting to LLE during transfer. Once upright  pt demonstrates good stability  Ambulation/Gait Ambulation/Gait assistance: Min guard Ambulation Distance (Feet): 100 Feet Assistive device: Rolling walker (2 wheeled) Gait Pattern/deviations: Step-to pattern Gait velocity: Decreased Gait velocity interpretation: <1.8 ft/sec, indicative of risk for recurrent falls General Gait Details: Pt able to progress to partial step-through pattern. Denies increase in L knee pain during ambulation. Cues for upright posture and decreased UE WB. Encouraged increased weight acceptance to LLE. Pt denies DOE. Ambulated from bed to RN station and then back to bathroom and Physicist, medical    Modified Rankin (Stroke Patients Only)       Balance Overall balance assessment: Needs assistance Sitting-balance support: No upper extremity supported Sitting balance-Leahy Scale: Good     Standing balance support: No upper extremity supported Standing balance-Leahy Scale: Fair                               Pertinent Vitals/Pain Pain Assessment: 0-10 Pain Score: 3  Pain Location: L knee Pain Descriptors / Indicators: Burning Pain Intervention(s): Premedicated before session;Monitored during session;Limited activity within patient's tolerance    Home Living Family/patient expects to be discharged to:: Private residence Living Arrangements: Spouse/significant other Available Help at Discharge: Family Type of Home: House Home Access: Stairs to enter Entrance Stairs-Rails: None Entrance Stairs-Number of Steps: 1 Home Layout: Multi-level;Able to live on main level with bedroom/bathroom Home Equipment: Gilford Rile - 2 wheels;Bedside commode;Shower seat;Grab bars - tub/shower;Grab bars - toilet      Prior Function  Level of Independence: Independent         Comments: Fully independent with ADLs/IADLs     Hand Dominance   Dominant Hand: Right    Extremity/Trunk Assessment   Upper Extremity  Assessment: Overall WFL for tasks assessed           Lower Extremity Assessment: LLE deficits/detail   LLE Deficits / Details: RLE grossly WFL. LLE: Pt able to perform full SLR and SAQ without assistance. Full DF/PF. Pt reports return of full sensation in LLE     Communication   Communication: No difficulties  Cognition Arousal/Alertness: Awake/alert Behavior During Therapy: WFL for tasks assessed/performed Overall Cognitive Status: Within Functional Limits for tasks assessed                      General Comments      Exercises Total Joint Exercises Ankle Circles/Pumps: Strengthening;Both;10 reps;Supine Quad Sets: Strengthening;Both;10 reps;Supine Gluteal Sets: Strengthening;Both;10 reps;Supine Towel Squeeze: Strengthening;Both;10 reps;Supine Short Arc Quad: Strengthening;Left;10 reps;Supine Heel Slides: Strengthening;Left;10 reps;Supine Hip ABduction/ADduction: Strengthening;Left;10 reps;Supine Straight Leg Raises: Strengthening;Left;10 reps;Supine Goniometric ROM: -5 to 75 degrees AAROM, pain limited      Assessment/Plan    PT Assessment Patient needs continued PT services  PT Diagnosis Difficulty walking;Abnormality of gait;Generalized weakness;Acute pain   PT Problem List Decreased strength;Decreased range of motion;Decreased activity tolerance;Decreased knowledge of use of DME;Decreased balance;Pain  PT Treatment Interventions DME instruction;Gait training;Stair training;Therapeutic activities;Therapeutic exercise;Balance training;Neuromuscular re-education;Patient/family education;Manual techniques   PT Goals (Current goals can be found in the Care Plan section) Acute Rehab PT Goals Patient Stated Goal: Improve function at home PT Goal Formulation: With patient/family Time For Goal Achievement: 11/09/15 Potential to Achieve Goals: Good    Frequency BID   Barriers to discharge        Co-evaluation               End of Session Equipment  Utilized During Treatment: Gait belt Activity Tolerance: Patient tolerated treatment well Patient left: in chair;with call bell/phone within reach;with family/visitor present (Working with OT who agrees to [employer]) Nurse Communication: Mobility status         Time: 0920-1000 PT Time Calculation (min) (ACUTE ONLY): 40 min   Charges:   PT Evaluation $PT Eval Low Complexity: 1 Procedure PT Treatments $Therapeutic Exercise: 8-22 mins   PT G Codes:       Lyndel Safe Huprich PT, DPT   Huprich,Jason 10/26/2015, 10:36 AM

## 2015-10-27 LAB — CBC
HCT: 34.4 % — ABNORMAL LOW (ref 40.0–52.0)
Hemoglobin: 12 g/dL — ABNORMAL LOW (ref 13.0–18.0)
MCH: 30.3 pg (ref 26.0–34.0)
MCHC: 35 g/dL (ref 32.0–36.0)
MCV: 86.6 fL (ref 80.0–100.0)
PLATELETS: 121 10*3/uL — AB (ref 150–440)
RBC: 3.97 MIL/uL — AB (ref 4.40–5.90)
RDW: 13.5 % (ref 11.5–14.5)
WBC: 7 10*3/uL (ref 3.8–10.6)

## 2015-10-27 MED ORDER — TRAMADOL HCL 50 MG PO TABS: 50.0000 mg | ORAL_TABLET | ORAL | Status: DC | PRN

## 2015-10-27 MED ORDER — OXYCODONE HCL 5 MG PO TABS: 5.0000 mg | ORAL_TABLET | ORAL | Status: DC | PRN

## 2015-10-27 MED ORDER — ENOXAPARIN SODIUM 40 MG/0.4ML ~~LOC~~ SOLN: 40.0000 mg | SUBCUTANEOUS | Status: DC

## 2015-10-27 NOTE — Discharge Summary (Signed)
Physician Discharge Summary  Patient ID: [name] MRN: [id number] DOB/AGE: [birth year] 65 y.o.  Admit date: 10/25/2015 Discharge date: 10/27/2015  Admission Diagnoses:  PRIMARY OSTEOARTHRITIS   Discharge Diagnoses: Patient Active Problem List   Diagnosis Date Noted  . 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 S/P total knee arthroplasty 10/25/2015    Past Medical History  Diagnosis Date  . GERD (gastroesophageal reflux disease)   . Anginal pain ([place])   . Hypertension   . Sleep apnea   . Obesity   . Erectile dysfunction   . SOB (shortness of breath)   . BPH (benign prostatic hyperplasia)   . Osteoarthritis     first metacarpal of left hand  . Lumbosacral radiculitis   . Adenomatous polyp of colon   . Diverticulosis   . Complication of anesthesia     spinal anesthesia did not work for right TKR ended up with general     Transfusion: Autovac transfusion given the first 6 hours postoperatively   Consultants (if any):   case management for home health assistance  Discharged Condition: Improved  Hospital Course: [name] is an 65 y.o. male who was admitted 10/25/2015 with a diagnosis of degenerative arthrosis left knee and went to the operating room on 10/25/2015 and underwent the above named procedures.    Surgeries:Procedure(s): COMPUTER ASSISTED TOTAL KNEE ARTHROPLASTY on 10/25/2015  PRE-OPERATIVE DIAGNOSIS: Degenerative arthrosis of the left knee, primary  POST-OPERATIVE DIAGNOSIS: Same  PROCEDURE: Left total knee arthroplasty using computer-assisted navigation  SURGEON: Marciano Sequin. M.D.  ASSISTANT: Vance Peper, PA (present and scrubbed throughout the case, critical for assistance with exposure, retraction, instrumentation, and closure)  ANESTHESIA: spinal  ESTIMATED BLOOD LOSS: 100 mL  FLUIDS REPLACED: 1900 mL of crystalloid  TOURNIQUET TIME: 131 minutes  DRAINS: 2 medium drains to a reinfusion system  SOFT TISSUE RELEASES: Anterior cruciate  ligament, posterior cruciate ligament, deep and superficial medial collateral ligament, patellofemoral ligament   IMPLANTS UTILIZED: DePuy PFC Sigma size 5 posterior stabilized femoral component (cemented), size 5 MBT tibial component (cemented), 41 mm 3 peg oval dome patella (cemented), and a 10 mm stabilized rotating platform polyethylene insert.  INDICATIONS FOR SURGERY: [name] is a 65 y.o. year old male with a long history of progressive knee pain. X-rays demonstrated severe degenerative changes in tricompartmental fashion. The patient had not seen any significant improvement despite conservative nonsurgical intervention. After discussion of the risks and benefits of surgical intervention, the patient expressed understanding of the risks benefits and agree with plans for total knee arthroplasty.   The risks, benefits, and alternatives were discussed at length including but not limited to the risks of infection, bleeding, nerve injury, stiffness, blood clots, the need for revision surgery, cardiopulmonary complications, among others, and they were willing to proceed. Patient tolerated the surgery well. No complications .Patient was taken to PACU where she was stabilized and then transferred to the orthopedic floor.  Patient started on Lovenox 30 q 12 hrs. Foot pumps applied bilaterally at 80 mm hg. Heels elevated off bed with rolled towels. No evidence of DVT. Calves non tender. Negative Homan. Physical therapy started on day #1 for gait training and transfer with OT starting on  day #1 for ADL and assisted devices. Patient has done well with therapy. Ambulated greater than 500 feet upon being discharged. Was able to go up 4 steps and down safely  Patient's IV and foley were discontinued on day #1 and hemovac was d/c on day #2  along with dressing change.   He was given perioperative antibiotics:  Anti-infectives    Start     Dose/Rate Route Frequency Ordered Stop   10/25/15 1400   ceFAZolin (ANCEF) IVPB 2 g/50 mL premix     2 g 100 mL/hr over 30 Minutes Intravenous Every 6 hours 10/25/15 1310 10/26/15 0948   10/25/15 0100  ceFAZolin (ANCEF) 3 g in dextrose 5 % 50 mL IVPB     3 g 130 mL/hr over 30 Minutes Intravenous  Once 10/25/15 0058 10/25/15 0750    .  He was fitted with AV 1 compression foot pump devices bilaterally, instructed on heel pumps early ambulation, and fitted with TED stockings bilaterally for DVT prophylaxis.  He benefited maximally from the hospital stay and there were no complications.    Recent vital signs:  Filed Vitals:   10/26/15 1953 10/27/15 0420  BP: 131/63 125/69  Pulse: 59 59  Temp: 97.9 F (36.6 C) 97.6 F (36.4 C)  Resp: 18 18    Recent laboratory studies:  Lab Results  Component Value Date   HGB 12.0* 10/27/2015   HGB 12.5* 10/26/2015   HGB 14.8 10/07/2015   Lab Results  Component Value Date   WBC 7.0 10/27/2015   PLT 121* 10/27/2015   Lab Results  Component Value Date   INR 1.15 10/07/2015   Lab Results  Component Value Date   NA 137 10/26/2015   K 3.8 10/26/2015   CL 104 10/26/2015   CO2 28 10/26/2015   BUN 14 10/26/2015   CREATININE 1.03 10/26/2015   GLUCOSE 120* 10/26/2015    Discharge Medications:     Medication List    TAKE these medications        amoxicillin 500 MG tablet  Commonly known as:  AMOXIL  Take 500 mg by mouth. 4 capsules one hour prior to dental procedures     aspirin EC 81 MG tablet  Take 81 mg by mouth daily.     CALCIUM-VITAMIN D3 PO  Take 1 tablet by mouth daily.     [employer] SPECTRAVITE PO  Take 1 tablet by mouth daily.     doxazosin 8 MG tablet  Commonly known as:  CARDURA  Take 8 mg by mouth daily.     enoxaparin 40 MG/0.4ML injection  Commonly known as:  LOVENOX  Inject 0.4 mLs (40 mg total) into the skin daily.     Fish Oil Oil  Take 1,200 mg by mouth 2 (two) times daily.     fluticasone 50 MCG/ACT nasal spray  Commonly known as:  FLONASE  Place 2 sprays  into both nostrils daily.     furosemide 40 MG tablet  Commonly known as:  LASIX  Take 40 mg by mouth daily as needed.     lisinopril-hydrochlorothiazide 20-12.5 MG tablet  Commonly known as:  PRINZIDE,ZESTORETIC  Take 1 tablet by mouth 2 (two) times daily.     MELATONIN PO  Take 10 mg by mouth at bedtime.     Naproxen Sodium 220 MG Caps  Take 2 capsules by mouth 2 (two) times daily.     omeprazole 40 MG capsule  Commonly known as:  PRILOSEC  Take 40 mg by mouth daily.     oxyCODONE 5 MG immediate release tablet  Commonly known as:  Oxy IR/ROXICODONE  Take 1-2 tablets (5-10 mg total) by mouth every 4 (four) hours as needed for severe pain or breakthrough pain.     traMADol 50 MG  tablet  Commonly known as:  ULTRAM  Take 1-2 tablets (50-100 mg total) by mouth every 4 (four) hours as needed for moderate pain.     VITAMIN B-12 PO  Take 1,000 mcg by mouth daily.     VITAMIN C PO  Take 500 mg by mouth daily.     VITAMIN D-3 PO  Take 2,000 Units by mouth daily.        Diagnostic Studies: Dg Knee Left Port  10/25/2015  CLINICAL DATA:  Postop left knee replacement EXAM: PORTABLE LEFT KNEE - 1-2 VIEW COMPARISON:  None. FINDINGS: Changes of left knee replacement. No hardware or bony complicating feature. Soft tissue drains in place. IMPRESSION: Left knee replacement.  No complicating feature. Electronically Signed   By: Rolm Baptise M.D.   On: 10/25/2015 12:29    Disposition: 01-Home or Self Care      Discharge Instructions    Diet - low sodium heart healthy    Complete by:  As directed      Increase activity slowly    Complete by:  As directed            Follow-up Information    Follow up with [facility]., PA On 11/09/2015.   Specialty:  Physician Assistant   Why:  at 1:15pm   Contact information:   [place] [address] [phone]       Follow up with Dereck Leep, MD On 12/07/2015.   Specialty:  Orthopedic  Surgery   Why:  at 9:30am       Signed: Pharoah Goggins R. 10/27/2015, 6:53 AM

## 2015-10-27 NOTE — Discharge Instructions (Signed)

## 2015-10-27 NOTE — Care Management (Signed)
Patient discharging to home today. I have notified Corene Cornea with [facility] of patient discharge. No further questions from patient. Case closed.

## 2015-10-27 NOTE — Progress Notes (Signed)
Physical Therapy Treatment Patient Details Name: [name] MRN: [id number] DOB: [birth year] Today's Date: 10/27/2015    History of Present Illness Pt underwent L TKR without reported post-op complications. Attempted PT evaluation POD#0 but pt had not recovered full sensation and motor function. PT evaluation performed on POD#1. Pt denies falls in the last 12 months    PT Comments    Pt demonstrates excellent progress with therapy. Reports that yesterday evening he completed an additional 3 laps around RN station. Pt able to progress ambulation today with therapist. Gait quality is improving with cues. Pt able to complete stair training safely and confidently. Pt has met all PT barriers to discharge. He is safe to return home with wife and [facility] PT when medically indicated. Pt will benefit from skilled PT services to address deficits in strength, balance, and mobility in order to return to full function at home.    Follow Up Recommendations  Home health PT     Equipment Recommendations  None recommended by PT    Recommendations for Other Services       Precautions / Restrictions Precautions Precautions: Fall Restrictions Weight Bearing Restrictions: Yes LLE Weight Bearing: Weight bearing as tolerated    Mobility  Bed Mobility Overal bed mobility: Needs Assistance Bed Mobility: Sit to Supine;Supine to Sit     Supine to sit: Supervision Sit to supine: Supervision   General bed mobility comments: Improving sequencing with transfers. Pt still utilizes bed rails to complete  Transfers Overall transfer level: Needs assistance Equipment used: Rolling walker (2 wheeled) Transfers: Sit to/from Stand Sit to Stand: Min guard         General transfer comment: Improving speed, sequencing, and weight shift to LLE. Pt tends to still restrict full weight shift to LLE. Improving stability noted  Ambulation/Gait Ambulation/Gait assistance: Min guard Ambulation Distance (Feet): 250  Feet Assistive device: Rolling walker (2 wheeled) Gait Pattern/deviations: Step-through pattern;Antalgic Gait velocity: Decreased Gait velocity interpretation: <1.8 ft/sec, indicative of risk for recurrent falls General Gait Details: Cues to improve upright posture, WB through LLE, push off at terminal stance, and heel strike at initial contact. Decreased UE WB. Denies DOE and vitals remain WNL   Stairs Stairs: Yes Stairs assistance: Min guard Stair Management: Two rails;Step to pattern Number of Stairs: 4 General stair comments: Cues and education for proper sequencing with both phases of transfer. Pt able to complete with good safety and stablity noted. Pt also instructed how to perform 1 step backwards  Wheelchair Mobility    Modified Rankin (Stroke Patients Only)       Balance Overall balance assessment: Needs assistance Sitting-balance support: No upper extremity supported Sitting balance-Leahy Scale: Good     Standing balance support: No upper extremity supported Standing balance-Leahy Scale: Fair                      Cognition Arousal/Alertness: Awake/alert Behavior During Therapy: WFL for tasks assessed/performed Overall Cognitive Status: Within Functional Limits for tasks assessed                      Exercises Total Joint Exercises Hip ABduction/ADduction: Strengthening;10 reps;Both;Seated Long Arc Quad: Strengthening;Left;10 reps;Seated Knee Flexion: Strengthening;Left;10 reps;Seated Goniometric ROM: 0-94 degrees AAROM, pain limited Marching in Standing: Strengthening;Both;10 reps;Seated Other Exercises Other Exercises: Standing marches x 10, standing mini squats x 10, standing heel raises x 10    General Comments        Pertinent Vitals/Pain Pain Assessment: 0-10  Pain Score: 2  Pain Location: L knee Pain Intervention(s): Monitored during session;Premedicated before session    Home Living                      Prior  Function            PT Goals (current goals can now be found in the care plan section) Acute Rehab PT Goals Patient Stated Goal: To go home PT Goal Formulation: With patient/family Time For Goal Achievement: 11/09/15 Potential to Achieve Goals: Good Progress towards PT goals: Progressing toward goals    Frequency  BID    PT Plan Current plan remains appropriate    Co-evaluation             End of Session Equipment Utilized During Treatment: Gait belt Activity Tolerance: Patient tolerated treatment well Patient left: with call bell/phone within reach;with family/visitor present;in bed;with bed alarm set;with SCD's reapplied;with nursing/sitter in room (polar care in place, towel roll under heel)     Time: 1017-5102 PT Time Calculation (min) (ACUTE ONLY): 32 min  Charges:  $Gait Training: 8-22 mins $Therapeutic Exercise: 8-22 mins                    G Codes:      [name] PT, DPT   [name],[name] 10/27/2015, 10:20 AM

## 2015-10-27 NOTE — Progress Notes (Signed)
Completed discharge instructions with patient and spouse.  No complaints or questions.  Discharged to home.  Patient already has DME.  Prescriptions given to patient.

## 2015-10-27 NOTE — Progress Notes (Signed)
   Subjective: 2 Days Post-Op Procedure(s) (LRB): COMPUTER ASSISTED TOTAL KNEE ARTHROPLASTY (Left) Patient reports pain as 0 on 0-10 scale.   Patient is well, and has had no acute complaints or problems Continue with physical therapy today.  Plan is to go Home after hospital stay. no nausea and no vomiting Patient denies any chest pains or shortness of breath. Objective: Vital signs in last 24 hours: Temp:  [97.6 F (36.4 C)-98.5 F (36.9 C)] 97.6 F (36.4 C) (02/08 0420) Pulse Rate:  [53-59] 59 (02/08 0420) Resp:  [18] 18 (02/08 0420) BP: (123-134)/(59-69) 125/69 mmHg (02/08 0420) SpO2:  [95 %-98 %] 95 % (02/08 0420) well approximated incision Heels are non tender and elevated off the bed using rolled towels Intake/Output from previous day: 02/07 0701 - 02/08 0700 In: 1090 [P.O.:840] Out: 880 [Urine:550; Drains:330] Intake/Output this shift: Total I/O In: 360 [P.O.:360] Out: 330 [Drains:330]   Recent Labs  10/26/15 0457 10/27/15 0437  HGB 12.5* 12.0*    Recent Labs  10/26/15 0457 10/27/15 0437  WBC 8.8 7.0  RBC 4.12* 3.97*  HCT 35.4* 34.4*  PLT 137* 121*    Recent Labs  10/26/15 0457  NA 137  K 3.8  CL 104  CO2 28  BUN 14  CREATININE 1.03  GLUCOSE 120*  CALCIUM 8.5*   No results for input(s): LABPT, INR in the last 72 hours.  EXAM General - Patient is Alert, Appropriate and Oriented Extremity - Neurologically intact Neurovascular intact Sensation intact distally Intact pulses distally Dorsiflexion/Plantar flexion intact Dressing - scant drainage Motor Function - intact, moving foot and toes well on exam.    Past Medical History  Diagnosis Date  . GERD (gastroesophageal reflux disease)   . Anginal pain ([place])   . Hypertension   . Sleep apnea   . Obesity   . Erectile dysfunction   . SOB (shortness of breath)   . BPH (benign prostatic hyperplasia)   . Osteoarthritis     first metacarpal of left hand  . Lumbosacral radiculitis   .  Adenomatous polyp of colon   . Diverticulosis   . Complication of anesthesia     spinal anesthesia did not work for right TKR ended up with general    Assessment/Plan: 2 Days Post-Op Procedure(s) (LRB): COMPUTER ASSISTED TOTAL KNEE ARTHROPLASTY (Left) Active Problems:   Left knee DJD   S/P total knee arthroplasty  Estimated body mass index is 39.77 kg/(m^2) as calculated from the following:   Height as of this encounter: 5\' 11"  (1.803 m).   Weight as of this encounter: 129.275 kg (285 lb). Up with therapy Discharge home with home health today after patient does steps  Labs: Were reviewed DVT Prophylaxis - Lovenox, Foot Pumps and TED hose Weight-Bearing as tolerated to left leg D/C O2 and Pulse OX and try on Room Air Patient needs to do steps this morning. Afterwards he may be discharged. Please change dressing prior to being discharged to give the patient 2 extreme honeycomb dressings to take home.  Jillyn Ledger. [place] 10/27/2015, 6:42 AM

## 2015-10-30 ENCOUNTER — Emergency Department: Payer: BLUE CROSS/BLUE SHIELD

## 2015-10-30 ENCOUNTER — Emergency Department
Admission: EM | Admit: 2015-10-30 | Discharge: 2015-10-30 | Disposition: A | Discharge status: Home / Self Care | Payer: BLUE CROSS/BLUE SHIELD | Attending: Emergency Medicine | Admitting: Emergency Medicine

## 2015-10-30 ENCOUNTER — Encounter: Payer: Self-pay | Admitting: Emergency Medicine

## 2015-10-30 DIAGNOSIS — M79652 Pain in left thigh: Secondary | ICD-10-CM | POA: Diagnosis present

## 2015-10-30 DIAGNOSIS — Z7951 Long term (current) use of inhaled steroids: Secondary | ICD-10-CM | POA: Diagnosis not present

## 2015-10-30 DIAGNOSIS — I10 Essential (primary) hypertension: Secondary | ICD-10-CM | POA: Insufficient documentation

## 2015-10-30 DIAGNOSIS — Z791 Long term (current) use of non-steroidal anti-inflammatories (NSAID): Secondary | ICD-10-CM | POA: Diagnosis not present

## 2015-10-30 DIAGNOSIS — Z7982 Long term (current) use of aspirin: Secondary | ICD-10-CM | POA: Insufficient documentation

## 2015-10-30 DIAGNOSIS — R600 Localized edema: Secondary | ICD-10-CM | POA: Insufficient documentation

## 2015-10-30 DIAGNOSIS — Z87891 Personal history of nicotine dependence: Secondary | ICD-10-CM | POA: Insufficient documentation

## 2015-10-30 DIAGNOSIS — Z79899 Other long term (current) drug therapy: Secondary | ICD-10-CM | POA: Diagnosis not present

## 2015-10-30 DIAGNOSIS — Z96652 Presence of left artificial knee joint: Secondary | ICD-10-CM | POA: Insufficient documentation

## 2015-10-30 LAB — CBC WITH DIFFERENTIAL/PLATELET
Basophils Absolute: 0 10*3/uL (ref 0–0.1)
Basophils Relative: 0 %
EOS ABS: 0.2 10*3/uL (ref 0–0.7)
EOS PCT: 3 %
HCT: 35.8 % — ABNORMAL LOW (ref 40.0–52.0)
Hemoglobin: 12.4 g/dL — ABNORMAL LOW (ref 13.0–18.0)
LYMPHS ABS: 0.6 10*3/uL — AB (ref 1.0–3.6)
Lymphocytes Relative: 11 %
MCH: 30.1 pg (ref 26.0–34.0)
MCHC: 34.7 g/dL (ref 32.0–36.0)
MCV: 86.6 fL (ref 80.0–100.0)
MONO ABS: 0.4 10*3/uL (ref 0.2–1.0)
MONOS PCT: 7 %
Neutro Abs: 4.6 10*3/uL (ref 1.4–6.5)
Neutrophils Relative %: 79 %
Platelets: 180 10*3/uL (ref 150–440)
RBC: 4.13 MIL/uL — ABNORMAL LOW (ref 4.40–5.90)
RDW: 13.2 % (ref 11.5–14.5)
WBC: 5.8 10*3/uL (ref 3.8–10.6)

## 2015-10-30 LAB — COMPREHENSIVE METABOLIC PANEL
ALK PHOS: 73 U/L (ref 38–126)
ALT: 22 U/L (ref 17–63)
AST: 27 U/L (ref 15–41)
Albumin: 3.6 g/dL (ref 3.5–5.0)
Anion gap: 7 (ref 5–15)
BILIRUBIN TOTAL: 0.8 mg/dL (ref 0.3–1.2)
BUN: 15 mg/dL (ref 6–20)
CALCIUM: 9.3 mg/dL (ref 8.9–10.3)
CO2: 30 mmol/L (ref 22–32)
CREATININE: 0.88 mg/dL (ref 0.61–1.24)
Chloride: 101 mmol/L (ref 101–111)
Glucose, Bld: 207 mg/dL — ABNORMAL HIGH (ref 65–99)
Potassium: 4.3 mmol/L (ref 3.5–5.1)
Sodium: 138 mmol/L (ref 135–145)
TOTAL PROTEIN: 6.5 g/dL (ref 6.5–8.1)

## 2015-10-30 LAB — SEDIMENTATION RATE: SED RATE: 53 mm/h — AB (ref 0–20)

## 2015-10-30 NOTE — ED Notes (Signed)
L knee noted to be swollen, pink, hot to touch compared to R.

## 2015-10-30 NOTE — ED Notes (Signed)
Report received from [place], [place].

## 2015-10-30 NOTE — ED Notes (Signed)
Knee replacement 6 days ago, began looking more swollen and red yesterday.

## 2015-10-30 NOTE — ED Provider Notes (Signed)
[facility] Emergency Department Provider Note  ____________________________________________  Time seen: Approximately 5 PM  I have reviewed the triage vital signs and the nursing notes.   HISTORY  Chief Complaint Knee Pain    HPI [name] is a 65 y.o. male who recently had a left-sided knee replacement this past Monday is presenting today with 1 of left thigh swelling and pain. He says he has been compliant with his daily aspirin as well as Lovenox injections. He has also been doing physical therapy. He says that he is able to range left knee but with some stiffness in the thigh. He denies any fever. Minimal pain to the area of the joint itself. No trauma to the area.   Past Medical History  Diagnosis Date  . GERD (gastroesophageal reflux disease)   . Anginal pain ([place])   . Hypertension   . Sleep apnea   . Obesity   . Erectile dysfunction   . SOB (shortness of breath)   . BPH (benign prostatic hyperplasia)   . Osteoarthritis     first metacarpal of left hand  . Lumbosacral radiculitis   . Adenomatous polyp of colon   . Diverticulosis   . Complication of anesthesia     spinal anesthesia did not work for right TKR ended up with general    Patient Active Problem List   Diagnosis Date Noted  . 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 S/P total knee arthroplasty 10/25/2015    Past Surgical History  Procedure Laterality Date  . Prostratate surgery    . Excision of of lipoma    . Colon surgery    . Cholecystectomy    . Vasectomy    . Knee artthroscopy Right   . Right total knee arthroplasty Right 09/28/2010  . Colonoscopy with propofol N/A 06/24/2015    Procedure: COLONOSCOPY WITH PROPOFOL;  Surgeon: Hulen Luster, MD;  Location: [facility] ENDOSCOPY;  Service: Gastroenterology;  Laterality: N/A;  . Lipoma excision    . Knee arthroplasty Left 10/25/2015    Procedure: COMPUTER ASSISTED TOTAL KNEE ARTHROPLASTY;  Surgeon: Dereck Leep, MD;  Location: [facility] ORS;   Service: Orthopedics;  Laterality: Left;    Current Outpatient Rx  Name  Route  Sig  Dispense  Refill  . amoxicillin (AMOXIL) 500 MG tablet   Oral   Take 500 mg by mouth. 4 capsules one hour prior to dental procedures         . Ascorbic Acid (VITAMIN C PO)   Oral   Take 500 mg by mouth daily.         Marland Kitchen aspirin EC 81 MG tablet   Oral   Take 81 mg by mouth daily.         . Calcium Carbonate-Vitamin D (CALCIUM-VITAMIN D3 PO)   Oral   Take 1 tablet by mouth daily.         . Cholecalciferol (VITAMIN D-3 PO)   Oral   Take 2,000 Units by mouth daily.         . Cyanocobalamin (VITAMIN B-12 PO)   Oral   Take 1,000 mcg by mouth daily.         Marland Kitchen doxazosin (CARDURA) 8 MG tablet   Oral   Take 8 mg by mouth daily.         Marland Kitchen enoxaparin (LOVENOX) 40 MG/0.4ML injection   Subcutaneous   Inject 0.4 mLs (40 mg total) into the skin daily.   14 Syringe  0   . Fish Oil OIL   Oral   Take 1,200 mg by mouth 2 (two) times daily.         . fluticasone (FLONASE) 50 MCG/ACT nasal spray   Each Nare   Place 2 sprays into both nostrils daily.         . furosemide (LASIX) 40 MG tablet   Oral   Take 40 mg by mouth daily as needed.          Marland Kitchen lisinopril-hydrochlorothiazide (PRINZIDE,ZESTORETIC) 20-12.5 MG tablet   Oral   Take 1 tablet by mouth 2 (two) times daily.          Marland Kitchen MELATONIN PO   Oral   Take 10 mg by mouth at bedtime.         . Multiple Vitamins-Minerals ([employer] SPECTRAVITE PO)   Oral   Take 1 tablet by mouth daily.         . Naproxen Sodium 220 MG CAPS   Oral   Take 2 capsules by mouth 2 (two) times daily.         Marland Kitchen omeprazole (PRILOSEC) 40 MG capsule   Oral   Take 40 mg by mouth daily.         Marland Kitchen oxyCODONE (OXY IR/ROXICODONE) 5 MG immediate release tablet   Oral   Take 1-2 tablets (5-10 mg total) by mouth every 4 (four) hours as needed for severe pain or breakthrough pain.   80 tablet   0   . traMADol (ULTRAM) 50 MG tablet   Oral    Take 1-2 tablets (50-100 mg total) by mouth every 4 (four) hours as needed for moderate pain.   60 tablet   1     Allergies Antihistamines, diphenhydramine-type and Adhesive  No family history on file.  Social History Social History  Substance Use Topics  . Smoking status: Former Research scientist (life sciences)  . Smokeless tobacco: Never Used  . Alcohol Use: No    Review of Systems Constitutional: No fever/chills Eyes: No visual changes. ENT: No sore throat. Cardiovascular: Denies chest pain. Respiratory: Denies shortness of breath. Gastrointestinal: No abdominal pain.  No nausea, no vomiting.  No diarrhea.  No constipation. Genitourinary: Negative for dysuria. Musculoskeletal: Negative for back pain. Skin: Negative for rash. Neurological: Negative for headaches, focal weakness or numbness.  10-point ROS otherwise negative.  ____________________________________________   PHYSICAL EXAM:  VITAL SIGNS: ED Triage Vitals  Enc Vitals Group     BP 10/30/15 1224 147/69 mmHg     Pulse Rate 10/30/15 1224 71     Resp 10/30/15 1224 20     Temp 10/30/15 1224 98 F (36.7 C)     Temp Source 10/30/15 1224 Oral     SpO2 10/30/15 1224 100 %     Weight 10/30/15 1224 285 lb (129.275 kg)     Height 10/30/15 1224 5\' 11"  (1.803 m)     Head Cir --      Peak Flow --      Pain Score 10/30/15 1226 2     Pain Loc --      Pain Edu? --      Excl. in [place]? --     Constitutional: Alert and oriented. Well appearing and in no acute distress. Eyes: Conjunctivae are normal. PERRL. EOMI. Head: Atraumatic. Nose: No congestion/rhinnorhea. Mouth/Throat: Mucous membranes are moist.  Oropharynx non-erythematous. Neck: No stridor.   Cardiovascular: Normal rate, regular rhythm. Grossly normal heart sounds.  Good peripheral circulation with dorsalis pedis pulse  of left side intact. Respiratory: Normal respiratory effort.  No retractions. Lungs CTAB. Gastrointestinal: Soft and nontender. No distention.  Musculoskeletal:  Swelling to the left thigh with mild tenderness. There is no ropelike structure posteriorly. No large pleural effusion. The incision is intact, clean and dry. Able to passively range the knee, smoothly without any resistance and only very mild pain which she describes more in the thigh. Neurologic:  Normal speech and language. No gross focal neurologic deficits are appreciated. No gait instability. Skin:  Skin is warm, dry and intact. No rash noted. Psychiatric: Mood and affect are normal. Speech and behavior are normal.  ____________________________________________   LABS (all labs ordered are listed, but only abnormal results are displayed)  Labs Reviewed  CBC WITH DIFFERENTIAL/PLATELET - Abnormal; Notable for the following:    RBC 4.13 (*)    Hemoglobin 12.4 (*)    HCT 35.8 (*)    Lymphs Abs 0.6 (*)    All other components within normal limits  COMPREHENSIVE METABOLIC PANEL - Abnormal; Notable for the following:    Glucose, Bld 207 (*)    All other components within normal limits  SEDIMENTATION RATE   ____________________________________________  EKG   ____________________________________________  RADIOLOGY  No evidence of left lower extremity DVT. Left knee arthroplasty without evidence of complication. ____________________________________________   PROCEDURES   ____________________________________________   INITIAL IMPRESSION / ASSESSMENT AND PLAN / ED COURSE  Pertinent labs & imaging results that were available during my care of the patient were reviewed by me and considered in my medical decision making (see chart for details).  Concern for DVT. Less likely septic joint.  ----------------------------------------- 8:09 PM on 10/30/2015 -----------------------------------------  Discussed with Dr. Roland Rack who says that as long as the patient has been elevating his leg properly that it may be a fluid shift that is causing the swelling of the left upper thigh. I  asked the patient and he does say that he has been elevating his left lower extremity. I do not see clinical signs of cellulitis and the patient has normal white blood cell count. No signs of DVT on his ultrasound. Likely fluid shifts. The patient will continue his physical therapy and he was advised to follow-up by Tuesday with Dr. Marry Guan.  ____________________________________________   FINAL CLINICAL IMPRESSION(S) / ED DIAGNOSES  Left lower extremity edema.    Orbie Pyo, MD 10/30/15 2010

## 2015-10-30 NOTE — Discharge Instructions (Signed)
Edema °Edema is an abnormal buildup of fluids in your body tissues. Edema is somewhat dependent on gravity to pull the fluid to the lowest place in your body. That makes the condition more common in the legs and thighs (lower extremities). Painless swelling of the feet and ankles is common and becomes more likely as you get older. It is also common in looser tissues, like around your eyes.  °When the affected area is squeezed, the fluid may move out of that spot and leave a dent for a few moments. This dent is called pitting.  °CAUSES  °There are many possible causes of edema. Eating too much salt and being on your feet or sitting for a long time can cause edema in your legs and ankles. Hot weather may make edema worse. Common medical causes of edema include: °· Heart failure. °· Liver disease. °· Kidney disease. °· Weak blood vessels in your legs. °· Cancer. °· An injury. °· Pregnancy. °· Some medications. °· Obesity.  °SYMPTOMS  °Edema is usually painless. Your skin may look swollen or shiny.  °DIAGNOSIS  °Your health care provider may be able to diagnose edema by asking about your medical history and doing a physical exam. You may need to have tests such as X-rays, an electrocardiogram, or blood tests to check for medical conditions that may cause edema.  °TREATMENT  °Edema treatment depends on the cause. If you have heart, liver, or kidney disease, you need the treatment appropriate for these conditions. General treatment may include: °· Elevation of the affected body part above the level of your heart. °· Compression of the affected body part. Pressure from elastic bandages or support stockings squeezes the tissues and forces fluid back into the blood vessels. This keeps fluid from entering the tissues. °· Restriction of fluid and salt intake. °· Use of a water pill (diuretic). These medications are appropriate only for some types of edema. They pull fluid out of your body and make you urinate more often. This  gets rid of fluid and reduces swelling, but diuretics can have side effects. Only use diuretics as directed by your health care provider. °HOME CARE INSTRUCTIONS  °· Keep the affected body part above the level of your heart when you are lying down.   °· Do not sit still or stand for prolonged periods.   °· Do not put anything directly under your knees when lying down. °· Do not wear constricting clothing or garters on your upper legs.   °· Exercise your legs to work the fluid back into your blood vessels. This may help the swelling go down.   °· Wear elastic bandages or support stockings to reduce ankle swelling as directed by your health care provider.   °· Eat a low-salt diet to reduce fluid if your health care provider recommends it.   °· Only take medicines as directed by your health care provider.  °SEEK MEDICAL CARE IF:  °· Your edema is not responding to treatment. °· You have heart, liver, or kidney disease and notice symptoms of edema. °· You have edema in your legs that does not improve after elevating them.   °· You have sudden and unexplained weight gain. °SEEK IMMEDIATE MEDICAL CARE IF:  °· You develop shortness of breath or chest pain.   °· You cannot breathe when you lie down. °· You develop pain, redness, or warmth in the swollen areas.   °· You have heart, liver, or kidney disease and suddenly get edema. °· You have a fever and your symptoms suddenly get worse. °MAKE SURE YOU:  °·   Understand these instructions. °· Will watch your condition. °· Will get help right away if you are not doing well or get worse. °  °This information is not intended to replace advice given to you by your health care provider. Make sure you discuss any questions you have with your health care provider. °  °Document Released: 09/04/2005 Document Revised: 09/25/2014 Document Reviewed: 06/27/2013 °Elsevier Interactive Patient Education ©2016 Elsevier Inc. ° °

## 2015-10-30 NOTE — ED Notes (Signed)
Pt denies needs at this time. Po fluids provided per md consent. Call bell provided to pt with blankets.

## 2016-04-24 DIAGNOSIS — I493 Ventricular premature depolarization: Secondary | ICD-10-CM | POA: Insufficient documentation

## 2016-04-24 DIAGNOSIS — R002 Palpitations: Secondary | ICD-10-CM | POA: Insufficient documentation

## 2016-05-10 DIAGNOSIS — Z9289 Personal history of other medical treatment: Secondary | ICD-10-CM

## 2016-05-10 HISTORY — DX: Personal history of other medical treatment: Z92.89

## 2016-06-12 ENCOUNTER — Encounter
Admission: RE | Admit: 2016-06-12 | Discharge: 2016-06-12 | Disposition: A | Discharge status: Home / Self Care | Payer: BLUE CROSS/BLUE SHIELD | Source: Ambulatory Visit | Attending: Cardiology | Admitting: Cardiology

## 2016-06-12 ENCOUNTER — Ambulatory Visit
Admission: RE | Admit: 2016-06-12 | Discharge: 2016-06-12 | Disposition: A | Discharge status: Home / Self Care | Payer: BLUE CROSS/BLUE SHIELD | Source: Ambulatory Visit | Attending: Cardiology | Admitting: Cardiology

## 2016-06-12 DIAGNOSIS — I10 Essential (primary) hypertension: Secondary | ICD-10-CM

## 2016-06-12 DIAGNOSIS — Z01812 Encounter for preprocedural laboratory examination: Secondary | ICD-10-CM | POA: Insufficient documentation

## 2016-06-12 DIAGNOSIS — Z01818 Encounter for other preprocedural examination: Secondary | ICD-10-CM | POA: Insufficient documentation

## 2016-06-12 LAB — CBC
HCT: 41.6 % (ref 40.0–52.0)
Hemoglobin: 14.2 g/dL (ref 13.0–18.0)
MCH: 28.8 pg (ref 26.0–34.0)
MCHC: 34.1 g/dL (ref 32.0–36.0)
MCV: 84.3 fL (ref 80.0–100.0)
PLATELETS: 138 10*3/uL — AB (ref 150–440)
RBC: 4.93 MIL/uL (ref 4.40–5.90)
RDW: 14.3 % (ref 11.5–14.5)
WBC: 5.3 10*3/uL (ref 3.8–10.6)

## 2016-06-12 LAB — BASIC METABOLIC PANEL
Anion gap: 7 (ref 5–15)
BUN: 20 mg/dL (ref 6–20)
CALCIUM: 9.5 mg/dL (ref 8.9–10.3)
CO2: 29 mmol/L (ref 22–32)
CREATININE: 0.95 mg/dL (ref 0.61–1.24)
Chloride: 102 mmol/L (ref 101–111)
GFR calc Af Amer: >60 mL/min (ref >60)
GLUCOSE: 90 mg/dL (ref 65–99)
Potassium: 3.7 mmol/L (ref 3.5–5.1)
SODIUM: 138 mmol/L (ref 135–145)

## 2016-06-12 LAB — SURGICAL PCR SCREEN
MRSA, PCR: NEGATIVE
STAPHYLOCOCCUS AUREUS: NEGATIVE

## 2016-06-12 LAB — APTT: aPTT: 31 seconds (ref 24–36)

## 2016-06-12 LAB — PROTIME-INR
INR: 0.99
Prothrombin Time: 13.1 seconds (ref 11.4–15.2)

## 2016-06-12 NOTE — Patient Instructions (Signed)
Your procedure is scheduled on: October 11,2017 Su procedimiento est programado para: Report to [place] a: To find out your arrival time please call [phone] between [place] on Tuesday June 27, 2016 Para saber su hora de llegada por favor llame al ([place]:  Remember: Instructions that are not followed completely may result in serious medical risk, up to and including death, or upon the discretion of your surgeon and anesthesiologist your surgery may need to be rescheduled.  Recuerde: Las instrucciones que no se siguen completamente Heritage manager en un riesgo de salud grave, incluyendo hasta la [place] o a discrecin de su cirujano y Environmental health practitioner, su ciruga se puede posponer.   __X__ 1. Do not eat food or drink liquids after midnight. No gum chewing or hard candies.  No coma alimentos ni tome lquidos despus de la medianoche.  No mastique chicle ni caramelos  duros.     ___X_ 2. No alcohol for 24 hours before or after surgery.    No tome alcohol durante las 24 horas antes ni despus de la [place].   __X__ 3. Bring all medications with you on the day of surgery if instructed. BRING ANY NEW MEDICATIONS    Lleve todos los medicamentos con usted el da de su ciruga si se le ha indicado as.   ___X_ 4. Notify your doctor if there is any change in your medical condition (cold, fever,                             infections).    Informe a su mdico si hay algn cambio en su condicin mdica (resfriado, fiebre, infecciones).   Do not wear jewelry, make-up, hairpins, clips or nail polish.  No use joyas, maquillajes, pinzas/ganchos para el cabello ni esmalte de uas.  Do not wear lotions, powders, or perfumes. You may wear deodorant.  No use lociones, polvos o perfumes.  Puede usar desodorante.    Do not shave 48 hours prior to surgery. Men may shave face and neck.  No se afeite 48 horas antes de la [place].  Los  hombres pueden [employer] cara y el cuello.   Do not bring valuables to the hospital.   No lleve objetos [place] is not responsible for any belongings or valuables.  [place] no se hace responsable de ningn tipo de pertenencias u objetos de Geographical information systems officer.               Contacts, dentures or bridgework may not be worn into surgery.  Los lentes de [place], las dentaduras postizas o puentes no se pueden usar en la [place].  Leave your suitcase in the car. After surgery it may be brought to your room.  Deje su maleta en el auto.  Despus de la ciruga podr traerla a su habitacin.  For patients admitted to the hospital, discharge time is determined by your treatment team.  Para los pacientes que sean ingresados al hospital, el tiempo en el cual se le dar de alta es determinado por su                equipo de [place].   Patients discharged the day of surgery will not be allowed to drive home. A los pacientes que se les da de alta el mismo da de la ciruga no se les permitir conducir a Holiday representative.  Please read over the following fact sheets that you were given: Por favor [place] informacin que le dieron:   CHG SOAP    _X_ Take these medicines the morning of surgery with A SIP OF WATER:          [employer] medicinas la maana de la ciruga con UN SORBO DE AGUA:  1. CARDURA  2. LISINOPRIL  3. OMEPRAZOLE THE NIGHT BEFORE AND MORNING OF SURGERY  4.       5.  6.                    X    1 USE FLONASE ____ [place] Enema (as directed)          Enema de [place] (segn lo indicado)    __X__ Use CHG Soap as directed          Utilice el jabn de CHG segn lo indicado  ____ Use inhalers on the day of surgery          Use los inhaladores el da de la ciruga  ____ Stop metformin 2 days prior to surgery          Deje de tomar el metformin 2 das antes de la ciruga    ____ Take 1/2 of usual insulin dose the night before surgery and none on the  morning of surgery           Tome la mitad de la dosis habitual de insulina la noche antes de la [place] y no tome nada en la maana de la             ciruga  __X__ Stop Coumadin/Plavix/aspirin on AS INSTRUCTED BY CARDIOLOGIST          Deje de tomar el Coumadin/Plavix/aspirina el da:  __X__ Stop Anti-inflammatories on 06-21-2016 - IBUPROFEN, ALEVE, ADVIL,NAPROXEN,MOTRIN          Deje de tomar antiinflamatorios el da:   __X__ Stop supplements until after surgery 06-21-2016 FISH OIL, VITAMIN C, CALCIUM, VITAMIN D, VITAMIN B-12, MULTIPLE VITAMIN          Deje de tomar suplementos hasta despus de la ciruga  _X___ Bring C-Pap to the hospital          [place] al hospital

## 2016-06-28 ENCOUNTER — Encounter
Admission: RE | Disposition: A | Discharge status: Home / Self Care | Payer: Self-pay | Source: Ambulatory Visit | Attending: Cardiology

## 2016-06-28 ENCOUNTER — Ambulatory Visit: Payer: BLUE CROSS/BLUE SHIELD | Admitting: Anesthesiology

## 2016-06-28 ENCOUNTER — Ambulatory Visit: Payer: BLUE CROSS/BLUE SHIELD

## 2016-06-28 ENCOUNTER — Observation Stay: Payer: BLUE CROSS/BLUE SHIELD

## 2016-06-28 ENCOUNTER — Encounter: Payer: Self-pay | Admitting: *Deleted

## 2016-06-28 ENCOUNTER — Ambulatory Visit
Admission: RE | Admit: 2016-06-28 | Discharge: 2016-06-29 | Disposition: A | Discharge status: Home / Self Care | Payer: BLUE CROSS/BLUE SHIELD | Source: Ambulatory Visit | Attending: Cardiology | Admitting: Cardiology

## 2016-06-28 DIAGNOSIS — Z79899 Other long term (current) drug therapy: Secondary | ICD-10-CM | POA: Diagnosis not present

## 2016-06-28 DIAGNOSIS — Z96653 Presence of artificial knee joint, bilateral: Secondary | ICD-10-CM | POA: Diagnosis not present

## 2016-06-28 DIAGNOSIS — Z87891 Personal history of nicotine dependence: Secondary | ICD-10-CM | POA: Insufficient documentation

## 2016-06-28 DIAGNOSIS — K219 Gastro-esophageal reflux disease without esophagitis: Secondary | ICD-10-CM | POA: Insufficient documentation

## 2016-06-28 DIAGNOSIS — Z7982 Long term (current) use of aspirin: Secondary | ICD-10-CM | POA: Insufficient documentation

## 2016-06-28 DIAGNOSIS — Z9989 Dependence on other enabling machines and devices: Secondary | ICD-10-CM | POA: Diagnosis not present

## 2016-06-28 DIAGNOSIS — Z23 Encounter for immunization: Secondary | ICD-10-CM | POA: Diagnosis not present

## 2016-06-28 DIAGNOSIS — I10 Essential (primary) hypertension: Secondary | ICD-10-CM | POA: Insufficient documentation

## 2016-06-28 DIAGNOSIS — Z950 Presence of cardiac pacemaker: Secondary | ICD-10-CM

## 2016-06-28 DIAGNOSIS — I495 Sick sinus syndrome: Secondary | ICD-10-CM | POA: Insufficient documentation

## 2016-06-28 DIAGNOSIS — G4733 Obstructive sleep apnea (adult) (pediatric): Secondary | ICD-10-CM | POA: Insufficient documentation

## 2016-06-28 DIAGNOSIS — I491 Atrial premature depolarization: Secondary | ICD-10-CM | POA: Insufficient documentation

## 2016-06-28 DIAGNOSIS — Z7951 Long term (current) use of inhaled steroids: Secondary | ICD-10-CM | POA: Diagnosis not present

## 2016-06-28 DIAGNOSIS — Z791 Long term (current) use of non-steroidal anti-inflammatories (NSAID): Secondary | ICD-10-CM | POA: Diagnosis not present

## 2016-06-28 HISTORY — PX: PACEMAKER INSERTION: SHX728

## 2016-06-28 HISTORY — DX: Presence of cardiac pacemaker: Z95.0

## 2016-06-28 SURGERY — INSERTION, CARDIAC PACEMAKER
Anesthesia: General | Site: Shoulder | Wound class: Clean

## 2016-06-28 MED ORDER — GLYCOPYRROLATE 0.2 MG/ML IJ SOLN
INTRAMUSCULAR | Status: DC | PRN
  Administered 2016-06-28: 0.2 mg via INTRAVENOUS

## 2016-06-28 MED ORDER — SODIUM CHLORIDE 0.9 % IR SOLN
Status: DC | PRN
  Administered 2016-06-28: 200 mL

## 2016-06-28 MED ORDER — MIDAZOLAM HCL 2 MG/2ML IJ SOLN
INTRAMUSCULAR | Status: DC | PRN
  Administered 2016-06-28: 2 mg via INTRAVENOUS

## 2016-06-28 MED ORDER — LIDOCAINE HCL (PF) 1 % IJ SOLN
INTRAMUSCULAR | Status: DC | PRN
  Administered 2016-06-28: 50 mg
  Administered 2016-06-28: 60 mg

## 2016-06-28 MED ORDER — FLUTICASONE PROPIONATE 50 MCG/ACT NA SUSP
2.0000 | Freq: Every day | NASAL | Status: DC
Start: 2016-06-28 — End: 2016-06-29
  Administered 2016-06-28 – 2016-06-29 (×2): 2 via NASAL
  Filled 2016-06-28: qty 16

## 2016-06-28 MED ORDER — SODIUM CHLORIDE 0.9 % IJ SOLN
INTRAMUSCULAR | Status: AC
  Filled 2016-06-28: qty 50

## 2016-06-28 MED ORDER — LACTATED RINGERS IV SOLN
INTRAVENOUS | Status: DC
Start: 2016-06-28 — End: 2016-06-28
  Administered 2016-06-28: 17:00:00 via INTRAVENOUS

## 2016-06-28 MED ORDER — LISINOPRIL 20 MG PO TABS
20.0000 mg | ORAL_TABLET | Freq: Every day | ORAL | Status: DC
  Administered 2016-06-28 – 2016-06-29 (×2): 20 mg via ORAL
  Filled 2016-06-28 (×2): qty 1

## 2016-06-28 MED ORDER — FENTANYL CITRATE (PF) 100 MCG/2ML IJ SOLN
25.0000 ug | INTRAMUSCULAR | Status: DC | PRN
  Administered 2016-06-28: 25 ug via INTRAVENOUS

## 2016-06-28 MED ORDER — LACTATED RINGERS IV SOLN: INTRAVENOUS | Status: DC

## 2016-06-28 MED ORDER — ACETAMINOPHEN 325 MG PO TABS: 325.0000 mg | ORAL_TABLET | ORAL | Status: DC | PRN

## 2016-06-28 MED ORDER — CEFAZOLIN IN D5W 1 GM/50ML IV SOLN
1.0000 g | Freq: Once | INTRAVENOUS | Status: AC
  Administered 2016-06-28: 1 g via INTRAVENOUS

## 2016-06-28 MED ORDER — PROPOFOL 500 MG/50ML IV EMUL
INTRAVENOUS | Status: DC | PRN
  Administered 2016-06-28: 150 ug/kg/min via INTRAVENOUS

## 2016-06-28 MED ORDER — CEFAZOLIN IN D5W 1 GM/50ML IV SOLN
1.0000 g | Freq: Four times a day (QID) | INTRAVENOUS | Status: AC
  Administered 2016-06-28 – 2016-06-29 (×3): 1 g via INTRAVENOUS
  Filled 2016-06-28 (×3): qty 50

## 2016-06-28 MED ORDER — LACTATED RINGERS IV SOLN
INTRAVENOUS | Status: DC | PRN
  Administered 2016-06-28: 12:00:00 via INTRAVENOUS

## 2016-06-28 MED ORDER — INFLUENZA VAC SPLIT QUAD 0.5 ML IM SUSY
0.5000 mL | PREFILLED_SYRINGE | INTRAMUSCULAR | Status: AC
  Administered 2016-06-29: 0.5 mL via INTRAMUSCULAR
  Filled 2016-06-28: qty 0.5

## 2016-06-28 MED ORDER — FENTANYL CITRATE (PF) 100 MCG/2ML IJ SOLN
INTRAMUSCULAR | Status: DC | PRN
  Administered 2016-06-28: 25 ug via INTRAVENOUS
  Administered 2016-06-28: 50 ug via INTRAVENOUS
  Administered 2016-06-28: 25 ug via INTRAVENOUS

## 2016-06-28 MED ORDER — FUROSEMIDE 40 MG PO TABS
40.0000 mg | ORAL_TABLET | Freq: Every day | ORAL | Status: DC
  Administered 2016-06-28 – 2016-06-29 (×2): 40 mg via ORAL
  Filled 2016-06-28 (×2): qty 1

## 2016-06-28 MED ORDER — ONDANSETRON HCL 4 MG/2ML IJ SOLN: 4.0000 mg | Freq: Once | INTRAMUSCULAR | Status: DC | PRN

## 2016-06-28 MED ORDER — DOXAZOSIN MESYLATE 4 MG PO TABS
8.0000 mg | ORAL_TABLET | Freq: Every day | ORAL | Status: DC
  Administered 2016-06-28 – 2016-06-29 (×2): 8 mg via ORAL
  Filled 2016-06-28 (×2): qty 2
  Filled 2016-06-28: qty 1

## 2016-06-28 MED ORDER — FENTANYL CITRATE (PF) 100 MCG/2ML IJ SOLN
INTRAMUSCULAR | Status: AC
  Filled 2016-06-28: qty 2

## 2016-06-28 MED ORDER — ONDANSETRON HCL 4 MG/2ML IJ SOLN: 4.0000 mg | Freq: Four times a day (QID) | INTRAMUSCULAR | Status: DC | PRN

## 2016-06-28 MED ORDER — SODIUM CHLORIDE 0.9 % IR SOLN
Freq: Once | Status: DC
  Filled 2016-06-28: qty 2

## 2016-06-28 MED ORDER — OXYCODONE-ACETAMINOPHEN 5-325 MG PO TABS
1.0000 | ORAL_TABLET | Freq: Four times a day (QID) | ORAL | Status: DC | PRN
  Administered 2016-06-28: 2 via ORAL
  Administered 2016-06-29: 1 via ORAL
  Filled 2016-06-28: qty 1
  Filled 2016-06-28: qty 2

## 2016-06-28 MED ORDER — LIDOCAINE 1 % OPTIME INJ - NO CHARGE
INTRAMUSCULAR | Status: DC | PRN
  Administered 2016-06-28: 30 mL

## 2016-06-28 SURGICAL SUPPLY — 35 items
BAG DECANTER FOR FLEXI CONT (MISCELLANEOUS) ×3 IMPLANT
BRUSH SCRUB 4% CHG (MISCELLANEOUS) IMPLANT
CABLE SURG 12 DISP A/V CHANNEL (MISCELLANEOUS) ×3 IMPLANT
CANISTER SUCT 1200ML W/VALVE (MISCELLANEOUS) ×3 IMPLANT
CHLORAPREP W/TINT 26ML (MISCELLANEOUS) ×3 IMPLANT
COVER LIGHT HANDLE STERIS (MISCELLANEOUS) ×6 IMPLANT
COVER MAYO STAND STRL (DRAPES) ×3 IMPLANT
DEVICE DISSECT PLASMABLAD 3.0S (MISCELLANEOUS) IMPLANT
DRAPE C-ARM XRAY 36X54 (DRAPES) ×3 IMPLANT
DRESSING TELFA 4X3 1S ST N-ADH (GAUZE/BANDAGES/DRESSINGS) ×3 IMPLANT
DRSG TEGADERM 4X4.75 (GAUZE/BANDAGES/DRESSINGS) ×3 IMPLANT
ELECT REM PT RETURN 9FT ADLT (ELECTROSURGICAL) ×3
ELECTRODE REM PT RTRN 9FT ADLT (ELECTROSURGICAL) ×1 IMPLANT
GLOVE BIO SURGEON STRL SZ7.5 (GLOVE) ×6 IMPLANT
GLOVE BIO SURGEON STRL SZ8 (GLOVE) ×3 IMPLANT
GOWN STRL REUS W/ TWL LRG LVL3 (GOWN DISPOSABLE) ×1 IMPLANT
GOWN STRL REUS W/ TWL XL LVL3 (GOWN DISPOSABLE) ×1 IMPLANT
GOWN STRL REUS W/TWL LRG LVL3 (GOWN DISPOSABLE) ×2
GOWN STRL REUS W/TWL XL LVL3 (GOWN DISPOSABLE) ×2
IMMOBILIZER SHDR MD LX WHT (SOFTGOODS) IMPLANT
IMMOBILIZER SHDR XL LX WHT (SOFTGOODS) ×3 IMPLANT
INTRO PACEMKR SHEATH II 7FR (MISCELLANEOUS) ×3
INTRODUCER PACEMKR SHTH II 7FR (MISCELLANEOUS) ×1 IMPLANT
IV NS 500ML (IV SOLUTION) ×2
IV NS 500ML BAXH (IV SOLUTION) ×1 IMPLANT
KIT RM TURNOVER STRD PROC AR (KITS) ×3 IMPLANT
LABEL OR SOLS (LABEL) ×3 IMPLANT
LEAD CAPSURE NOVUS 5076-52CM (Lead) ×3 IMPLANT
LEAD CAPSURE NOVUS 5076-58CM (Lead) ×3 IMPLANT
MARKER SKIN DUAL TIP RULER LAB (MISCELLANEOUS) ×3 IMPLANT
PACK PACE INSERTION (MISCELLANEOUS) ×3 IMPLANT
PAD STATPAD (MISCELLANEOUS) ×3 IMPLANT
PLASMABLADE 3.0S (MISCELLANEOUS)
PPM ADVISA MRI DR A2DR01 (Pacemaker) ×3 IMPLANT
SUT SILK 0 SH 30 (SUTURE) ×9 IMPLANT

## 2016-06-28 NOTE — Anesthesia Procedure Notes (Signed)
Date/Time: 06/28/2016 12:01 PM Performed by: Doreen Salvage Pre-anesthesia Checklist: Patient identified, Emergency Drugs available, Suction available and Patient being monitored Patient Re-evaluated:Patient Re-evaluated prior to inductionOxygen Delivery Method: Simple face mask Intubation Type: IV induction Dental Injury: Teeth and Oropharynx as per pre-operative assessment

## 2016-06-28 NOTE — Anesthesia Preprocedure Evaluation (Signed)
Anesthesia Evaluation  Patient identified by MRN, date of birth, ID band Patient awake    Reviewed: Allergy & Precautions, H&P , NPO status , Patient's Chart, lab work & pertinent test results, reviewed documented beta blocker date and time   History of Anesthesia Complications (+) PONV and history of anesthetic complications  Airway Mallampati: II  TM Distance: >3 FB Neck ROM: full    Dental no notable dental hx. (+) Poor Dentition   Pulmonary neg pulmonary ROS, sleep apnea and Continuous Positive Airway Pressure Ventilation , former smoker,    Pulmonary exam normal breath sounds clear to auscultation       Cardiovascular Exercise Tolerance: Good hypertension, + angina with exertion negative cardio ROS Normal cardiovascular exam Rhythm:regular Rate:Bradycardia     Neuro/Psych  Neuromuscular disease negative neurological ROS  negative psych ROS   GI/Hepatic negative GI ROS, Neg liver ROS, GERD  ,  Endo/Other  negative endocrine ROSdiabetes  Renal/GU negative Renal ROS  negative genitourinary   Musculoskeletal   Abdominal   Peds  Hematology negative hematology ROS (+)   Anesthesia Other Findings Past Medical History: No date: Adenomatous polyp of colon No date: Anginal pain (H[facility]) No date: BPH (benign prostatic hyperplasia) No date: Complication of anesthesia     Comment: spinal anesthesia did not work for right TKR               ended up with general No date: Diverticulosis No date: Erectile dysfunction No date: GERD (gastroesophageal reflux disease) No date: Hypertension No date: Lumbosacral radiculitis No date: Obesity No date: Osteoarthritis     Comment: first metacarpal of left hand No date: Sleep apnea No date: SOB (shortness of breath) Past Surgical History: No date: CHOLECYSTECTOMY No date: COLON SURGERY 06/24/2015: COLONOSCOPY WITH PROPOFOL N/A     Comment: Procedure: COLONOSCOPY WITH PROPOFOL;                 Surgeon: Hulen Luster, MD;  Location: [facility]               ENDOSCOPY;  Service: Gastroenterology;                Laterality: N/A; No date: Excision of of lipoma 10/25/2015: KNEE ARTHROPLASTY Left     Comment: Procedure: COMPUTER ASSISTED TOTAL KNEE               ARTHROPLASTY;  Surgeon: Dereck Leep, MD;                Location: [facility] ORS;  Service: Orthopedics;                Laterality: Left; No date: Knee artthroscopy Right No date: LIPOMA EXCISION No date: Prostratate surgery 09/28/2010: Right total knee arthroplasty Right No date: VASECTOMY BMI    Body Mass Index:  39.05 kg/m     Reproductive/Obstetrics negative OB ROS                             Anesthesia Physical Anesthesia Plan  ASA: III  Anesthesia Plan: General   Post-op Pain Management:    Induction:   Airway Management Planned:   Additional Equipment:   Intra-op Plan:   Post-operative Plan:   Informed Consent: I have reviewed the patients History and Physical, chart, labs and discussed the procedure including the risks, benefits and alternatives for the proposed anesthesia with the patient or authorized representative who has indicated his/her understanding and acceptance.  Dental Advisory Given  Plan Discussed with: CRNA  Anesthesia Plan Comments:         Anesthesia Quick Evaluation

## 2016-06-28 NOTE — Progress Notes (Signed)
Pt admitted to unit, s/p pacemaker placement, VSS, low reports of pain, Atrial paced on telemetry, small amount of blood on right upper chest dressing outlined and sling applied to left arm. I will continue to assess.

## 2016-06-28 NOTE — Progress Notes (Signed)
Pt complains of 6/10 pain in neck and left shoulder.  MD notified for orders. New orders received. I will continue to assess.

## 2016-06-28 NOTE — Progress Notes (Signed)
Patient received awake and alert. Atrial paced on telemetry. Denied any pain.  Left arm in sling. No further bleeding to dressing over pacemaker insertion site.

## 2016-06-28 NOTE — Transfer of Care (Signed)
Immediate Anesthesia Transfer of Care Note  Patient: [name]  Procedure(s) Performed: Procedure(s) with comments: INSERTION PACEMAKER (N/A) - left  Patient Location: PACU  Anesthesia Type:General  Level of Consciousness: sedated  Airway & Oxygen Therapy: Patient Spontanous Breathing and Patient connected to face mask oxygen  Post-op Assessment: Report given to RN and Post -op Vital signs reviewed and stable  Post vital signs: Reviewed and stable  Last Vitals:  Vitals:   06/28/16 1024 06/28/16 1323  BP: 126/62 (!) 144/91  Pulse: 68 (!) 58  Resp: 16 13  Temp: 36.8 C 0000000 C    Complications: No apparent anesthesia complications

## 2016-06-28 NOTE — Progress Notes (Signed)
Sling on

## 2016-06-28 NOTE — Op Note (Signed)
[facility] Cardiology   06/28/2016                     1:23 PM  PATIENT:  [name]    PRE-OPERATIVE DIAGNOSIS:  BRADYCARDIA  POST-OPERATIVE DIAGNOSIS:  Same  PROCEDURE:  INSERTION PACEMAKER  SURGEON:  Tamsin Nader, MD    ANESTHESIA:     PREOPERATIVE INDICATIONS:  [name] is a  65 y.o. male with a diagnosis of BRADYCARDIA who failed conservative measures and elected for surgical management.    The risks benefits and alternatives were discussed with the patient preoperatively including but not limited to the risks of infection, bleeding, cardiopulmonary complications, the need for revision surgery, among others, and the patient was willing to proceed.   OPERATIVE PROCEDURE: The patient was brought to the operating room in a  fasting state. The left pectoral region was prepped and draped in the usual sterile manner. Anesthesia was obtained with 1% lidocaine locally. A 6 cm incision was performed over the left pectoral region. The pacemaker pocket was generated by electrocautery and blunt dissection. Access was obtained to left subclavian vein by fine needle aspiration.Marland Kitchen MRI-compatible leads were positioned to right ventricular apical septum and right atrial appendage under fluoroscopic guidance. After proper thresholds were obtained the leads were sutured in place. The pacemaker pocket was irrigated with gentamicin solution. The leads were connected to a  MRI-compatible dual-chamber rate responsive pacemaker generator (Medtronic [id number] ) The pocket was closed with 2-0 and 4-0 Vicryl, respectively. Steri-Strips and pressure dressing were applied.

## 2016-06-28 NOTE — Interval H&P Note (Signed)
History and Physical Interval Note:  06/28/2016 [id number] AM  [name]  has presented today for surgery, with the diagnosis of BRADYCARDIA  The various methods of treatment have been discussed with the patient and family. After consideration of risks, benefits and other options for treatment, the patient has consented to  Procedure(s): INSERTION PACEMAKER (N/A) as a surgical intervention .  The patient's history has been reviewed, patient examined, no change in status, stable for surgery.  I have reviewed the patient's chart and labs.  Questions were answered to the patient's satisfaction.     [name]

## 2016-06-28 NOTE — Progress Notes (Signed)
MD notified about LR orders. MD order discontinue. I will continue to assess.

## 2016-06-28 NOTE — H&P (Signed)
<6>29299-5<7>Encounter Details<6>46240-8<7>Social History<6>29762-2<7>Instructions<6>69730-0<7>Progress Notes<6>10164-2<7>Plan of Treatment<6>18776-5<7>Visit Diagnoses<6>51848-0<7>/"> Jump to Section ? Document InformationEncounter DetailsInstructionsPatient DemographicsPlan of TreatmentProgress NotesReason for VisitSocial HistoryVisit Diagnoses [name], generated on Sep. 25, 2017 Printout Information  Document Contents Office Visit Document Received Date Sep. 25, 2017 [place]   Patient Demographics - 65 y.o. Male, born 1951-04-11   Patient Address Communication Language Race / Ethnicity  [address] [place], [place] [address] [phone] (Home) [phone] (Mobile) [email or web address] English (Preferred) White / Not Hispanic or Latino  Reason for Visit    Reason Comments  Follow-up echo/holter    Encounter Details    Date Type Department Care Team Description  05/23/2016 Office Visit [facility]  [place], [place] [address]  [phone]  Isaias Cowman, [facility]  [place], [place] [address]  [phone]  [phone] (Fax)  Sick sinus syndrome (CMS-HCC) (Primary Dx);Premature ventricular contractions;Essential hypertension;SOB (shortness of breath);Obstructive sleep apnea syndrome;Heart palpitations   Social History - as of this encounter   Tobacco Use Types Packs/Day Years Used Date  Former Smoker Cigarettes 0.25 4 Quit: 01/12/1973  Smokeless Tobacco: Never Used      Comments: QUIT 53YRS AGO   Alcohol Use Drinks/Week oz/Week Comments  No 0 Standard drinks or equivalent  0.0     Sex Assigned at Birth Date Recorded  Not on file    Instructions - in this encounter   Patient Instructions - Isaias Cowman, MD - 05/23/2016 8:45 AM EDT DASH Eating Plan DASH stands for  "Dietary Approaches to Stop Hypertension." The DASH eating plan is a healthy eating plan that has been shown to reduce high blood pressure (hypertension). Additional health benefits may include reducing the risk of type 2 diabetes mellitus, heart disease, and stroke. The DASH eating plan may also help with weight loss. WHAT DO I NEED TO KNOW ABOUT THE DASH EATING PLAN? For the DASH eating plan, you will follow these general guidelines:  Choose foods with less than 150 milligrams of sodium per serving (as listed on the food label).  Use salt-free seasonings or herbs instead of table salt or sea salt.  Check with your health care provider or pharmacist before using salt substitutes.  Eat lower-sodium products. These are often labeled as "low-sodium" or "no salt added."  Eat fresh foods. Avoid eating a lot of canned foods.  Eat more vegetables, fruits, and low-fat dairy products.  Choose whole grains. Look for the word "whole" as the first word in the ingredient list.  Choose fish and skinless chicken or [place] more often than red meat. Limit fish, poultry, and meat to 6 oz (170 g) each day.  Limit sweets, desserts, sugars, and sugary drinks.  Choose heart-healthy fats.  Eat more home-cooked food and less restaurant, buffet, and fast food.  Limit fried foods.  Do not fry foods. Cook foods using methods such as baking, boiling, grilling, and broiling instead.  When eating at a restaurant, ask that your food be prepared with less salt, or no salt if possible. WHAT FOODS CAN I EAT? Seek help from a dietitian for individual calorie needs. Grains Whole grain or whole wheat bread. Brown rice. Whole grain or whole wheat pasta. Quinoa, bulgur, and whole grain cereals. Low-sodium cereals. Corn or whole wheat flour tortillas. Whole grain cornbread. Whole grain crackers. Low-sodium crackers. Vegetables Fresh or frozen vegetables (raw, steamed, roasted, or grilled). Low-sodium or reduced-sodium  tomato and vegetable  juices. Low-sodium or reduced-sodium tomato sauce and paste. Low-sodium or reduced-sodium canned vegetables.  Fruits All fresh, canned (in natural juice), or frozen fruits. Meat and Other Protein Products Ground beef (85% or leaner), grass-fed beef, or beef trimmed of fat. Skinless chicken or [place]. Ground chicken or [place]. Pork trimmed of fat. All fish and seafood. Eggs. Dried beans, peas, or lentils. Unsalted nuts and seeds. Unsalted canned beans. Dairy Low-fat dairy products, such as skim or 1% milk, 2% or reduced-fat cheeses, low-fat ricotta or cottage cheese, or plain low-fat yogurt. Low-sodium or reduced-sodium cheeses. Fats and Oils Tub margarines without trans fats. Light or reduced-fat mayonnaise and salad dressings (reduced sodium). Avocado. Safflower, olive, or canola oils. Natural peanut or almond butter. Other Unsalted popcorn and pretzels. The items listed above may not be a complete list of recommended foods or beverages. Contact your dietitian for more options. WHAT FOODS ARE NOT RECOMMENDED? Grains White bread. White pasta. White rice. Refined cornbread. Bagels and croissants. Crackers that contain trans fat. Vegetables Creamed or fried vegetables. Vegetables in a cheese sauce. Regular canned vegetables. Regular canned tomato sauce and paste. Regular tomato and vegetable juices. Fruits Canned fruit in light or heavy syrup. Fruit juice. Meat and Other Protein Products Fatty cuts of meat. Ribs, chicken wings, bacon, sausage, bologna, salami, chitterlings, fatback, hot dogs, bratwurst, and packaged luncheon meats. Salted nuts and seeds. Canned beans with salt. Dairy Whole or 2% milk, cream, half-and-half, and cream cheese. Whole-fat or sweetened yogurt. Full-fat cheeses or blue cheese. Nondairy creamers and whipped toppings. Processed cheese, cheese spreads, or cheese curds. Condiments Onion and garlic salt, seasoned salt, table salt, and sea salt. Canned  and packaged gravies. Worcestershire sauce. Tartar sauce. Barbecue sauce. Teriyaki sauce. Soy sauce, including reduced sodium. Steak sauce. Fish sauce. Oyster sauce. Cocktail sauce. Horseradish. Ketchup and mustard. Meat flavorings and tenderizers. Bouillon cubes. Hot sauce. Tabasco sauce. Marinades. Taco seasonings. Relishes. Fats and Oils Butter, stick margarine, lard, shortening, ghee, and bacon fat. Coconut, palm kernel, or palm oils. Regular salad dressings. Other Pickles and olives. Salted popcorn and pretzels. The items listed above may not be a complete list of foods and beverages to avoid. Contact your dietitian for more information. WHERE CAN I FIND MORE INFORMATION? National Heart, Lung, and Blood Institute: travelstabloid.com  This information is not intended to replace advice given to you by your health care provider. Make sure you discuss any questions you have with your health care provider.  Document Released: 08/24/2011 Document Revised: 12/27/2015 Document Reviewed: 07/09/2013 Elsevier Interactive Patient Education 2017 [employer].     Progress Notes - in this encounter   Isaias Cowman, MD - 05/23/2016 8:45 AM EDT Formatting of this note may be different from the original. Established Patient Visit   Chief Complaint: Chief Complaint  Patient presents with  . Follow-up  echo/holter  Date of Service: 05/23/2016 Date of Birth: 26-Apr-1951 PCP: Loa Socks, MD  History of Present Illness: Mr. Eguia is a 65 y.o.male patient who the patient returns for  1. Sinus bradycardia  2. Essential hypertension  3. Premature atrial contractions 4. Sleep apnea   Patient returns today for follow-up. He denies chest pain but reports mild chronic exertional dyspnea. He reports a 6 week history of recurrent episodes of palpitations. The patient was noted to have low heart rate by his wife. The patient also complains of  generalized fatigue, and occasional dizziness. The patient is active, continues to go to the gym 3-4 times a week. The  patient has a history of sleep apnea on CPAP. Previous ETT sestamibi study 04/22/2013 did not reveal evidence for scar or ischemia. 24-hour Holter monitor from 04/24/2016 revealed predominant sinus bradycardia with a mean heart rate of 56 bpm, with a low heart rate of 33 bpm during sleep. Frequent premature atrial contractions were observed. Occasional atrial runs were observed, the longest lasting 30 beats. 2D echocardiogram was performed 05/10/2016 which revealed normal left ventricular function, with LV ejection fraction greater than 55%.  The patient has essential hypertension, blood pressure well controlled on lisinopril/HCTZ, which is well tolerated without apparent side effects. The patient follows a low-sodium, no added salt diet.  Past Medical and Surgical History  Past Medical History Past Medical History:  Diagnosis Date  . BPH (benign prostatic hypertrophy)  . Diverticulosis 05/05/2010  . ED (erectile dysfunction)  . GERD (gastroesophageal reflux disease)  . Hypertension  . Neck pain  . Obesity  . Seasonal allergies  . Sleep apnea  . 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   Medications and Allergies  Current Medications  Current Outpatient Prescriptions  Medication Sig Dispense Refill  . amoxicillin (AMOXIL) 500 MG tablet Take by mouth.  . ASCORBATE CALCIUM (VITAMIN C ORAL) Take 1 tablet by mouth once daily.   Marland Kitchen aspirin 81 MG EC tablet Take 81 mg by mouth once daily.  Marland Kitchen CALCIUM CARBONATE (CALCIUM 600 ORAL) Take 1 tablet  by mouth once daily.   . cyanocobalamin, vitamin B-12, (B-12 COMPLIANCE) 1,000 mcg/mL Kit  . doxazosin (CARDURA) 8 MG tablet TAKE 1 TABLET BY MOUTH ONCE DAILY 90 tablet 3  . ERGOCALCIFEROL, VITAMIN D2, (VITAMIN D3 ORAL) Take 1 capsule by mouth once daily.   . fluticasone (FLONASE) 50 mcg/actuation nasal spray Place 2 sprays into both nostrils once daily. 30 g 6  . FUROsemide (LASIX) 40 MG tablet TAKE ONE TABLET BY MOUTH ONCE DAILY AS NEEDED 90 tablet 1  . lisinopril-hydrochlorothiazide (PRINZIDE,ZESTORETIC) 20-12.5 mg tablet TAKE ONE TABLET BY MOUTH TWICE DAILY 180 tablet 3  . melatonin 10 mg Cap Take 1 capsule by mouth nightly.   . MULTIVITAMIN ORAL Take 1 tablet by mouth once daily.   . naproxen sodium (ALEVE, ANAPROX) 220 MG tablet Take 220 mg by mouth 2 (two) times daily with meals.  . OMEGA-3 FATTY ACIDS/FISH OIL (OMEGA 3 FISH OIL ORAL) Take 2 capsules by mouth once daily.   Marland Kitchen omeprazole (PRILOSEC) 40 MG DR capsule Take 1 capsule by mouth once daily. 90 capsule 3   No current facility-administered medications for this visit.   Allergies: Antihistamine [diphenhydramine-tripelen-menth]; Band-aid plus antibiotic [bacitracin zinc-polymyxin b]; Tegaderm ag mesh [silver]; and Adhesive  Social and Family History  Social History reports that he quit smoking about 43 years ago. His smoking use included Cigarettes. He has a 1.00 pack-year smoking history. He has never used smokeless tobacco. He reports that he does not drink alcohol or use illicit drugs.  Family History Family History  Problem Relation Age of Onset  . Stroke Father  . Hypertension Father  . Colon cancer Father  . Diabetes mellitus Father  . Arthritis Mother  . Colon polyps Brother   Review of Systems   Review of Systems: The patient denies chest pain, reports exertional shortness of breath,  without orthopnea, paroxysmal nocturnal dyspnea, pedal edema, intermittent palpitations, heart racing, without presyncope,  syncope. Review of 12 Systems is negative except as described above.  Physical Examination   Vitals:BP (P) 132/70  Pulse (P) 52  Ht (P) 180.3 cm (_0 )  Wt (!) (P) 110.6 kg (243 lb 12.8 oz)  BMI (P) 34 kg/m2 Ht:(P) 180.3 cm (_1 ) Wt:(!) (P) 110.6 kg (243 lb 12.8 oz) GNF:AOZH surface area is 2.35 meters squared (pended). Body mass index is 34 kg/(m^2) (pended).  HEENT: Pupils equally reactive to light and accomodation  Neck: Supple without thyromegaly, carotid pulses 2+ Lungs: clear to auscultation bilaterally; no wheezes, rales, rhonchi Heart: Regular rate and rhythm. No gallops, murmurs or rub Abdomen: soft nontender, nondistended, with normal bowel sounds Extremities: no cyanosis, clubbing, or edema Peripheral Pulses: 2+ in all extremities, 2+ femoral pulses bilaterally  Assessment   65 y.o. male with  1. Sick sinus syndrome (CMS-HCC)  2. Premature ventricular contractions  3. Essential hypertension  4. SOB (shortness of breath)  5. Obstructive sleep apnea syndrome  6. Heart palpitations   65 year old gentleman with six-week history of palpitations. Patient noted to be bradycardic. He does complain of exertional dyspnea, fatigue and occasional dizziness. 24-hour Holter monitor revealed predominant sinus bradycardia with a mean heart rate of 56 bpm, with frequent premature atrial contractions. 2D echocardiogram revealed normal left ventricular function. The patient has essential hypertension, blood pressure well controlled on current BP medications.  Plan   1. Continue current medications 2. Counseled patient about low-sodium diet 3. DASH diet printed instructions given to the patient 4. We discussed the potential benefits of permanent pacemaker implantation. The patient currently is hesitant to proceed. 5. Instructed the patient to keep heart rate diary 6. Return to clinic for follow-up in 3 months  No orders of the defined types were placed in this  encounter.  Return in about 3 months (around 08/22/2016).  Isaias Cowman, MD      Plan of Treatment - as of this encounter   Upcoming Encounters Upcoming Encounters  Date Type Specialty Care Team Description  08/23/2016 Ancillary Orders Lab Feldpausch, Donzetta Matters., MD  [place]  [facility]  [place], [place] [address]  [phone]  [phone] (Fax)    08/24/2016 Office Visit Cardiology Isaias Cowman, MD  [address]  [facility]  [place], [place] [address]  [phone]  [phone] ([phone]    08/31/2016 Office Visit Family Medicine Feldpausch, Donzetta Matters., MD  [facility] [facility] [place]  [place], [place] [address]  [phone]  [phone] (Fax)    10/10/2016 Office Visit Orthopaedics Lamar Benes., MD  [place],  [address]  [phone]  [phone] (Fax)     Visit Diagnoses - in this encounter   Diagnosis  Sick sinus syndrome (CMS-HCC) - Primary  Sinoatrial node dysfunction   Premature ventricular contractions  Other premature beats   Essential hypertension  SOB (shortness of breath)  Shortness of breath   Obstructive sleep apnea syndrome  Obstructive sleep apnea (adult) (pediatric)   Heart palpitations  Palpitations    Document Information   Service Providers Document Coverage Dates Sep. 05, 2017 - Sep. 25, 2017  [place] [phone] (Work) [place],  [id number]   Encounter Providers Isaias Cowman MD (Attending) tel:[phone] (Work) fax:[phone] [facility] [place],  [address]  Encounter Date Sep. 05, 2017   Show All Sections

## 2016-06-29 DIAGNOSIS — I495 Sick sinus syndrome: Secondary | ICD-10-CM | POA: Diagnosis not present

## 2016-06-29 MED ORDER — CEPHALEXIN 250 MG PO CAPS: 250.0000 mg | ORAL_CAPSULE | Freq: Four times a day (QID) | ORAL | 0 refills | Status: DC

## 2016-06-29 NOTE — Progress Notes (Signed)
Patient's wife reports emptying the urinal several times tonight. She says the total output is equivalent to the full urinal. Patient and wife educated on the importance of measuring the urine output. Patient's wife agreed to write the out put on the board.

## 2016-06-29 NOTE — Discharge Instructions (Signed)
Do not lift arm above head.

## 2016-06-29 NOTE — Progress Notes (Signed)
Patient being discharge with his wife to home.  Instructions given by MD and myself regarding care of the pacemaker site as well as future limitations, which are minimal.  IV DC'd.  Telemetry discontinued. Site has small amt of old drainage.  Location of drainage was marked yesterday and is unchanged today.

## 2016-06-29 NOTE — Discharge Summary (Signed)
   Physician Discharge Summary  Patient ID: HOLT LEARD MRN: [id number] DOB/AGE: Dec 18, 1950 65 y.o.  Admit date: 06/28/2016 Discharge date: 06/29/2016  Primary Discharge Diagnosis Bradycardia Secondary Discharge Diagnosis sick sinus syndrome  Significant Diagnostic Studies:   Consults: None  Hospital Course: The patient underwent elective dual-chamber pacemaker implantation on [id number] without complication. The patient had an uncomplicated hospital course on telemetry with intermittent atrial pacing and ventricular sensing. The patient was discharged home in stable condition on 06/29/2016.   Discharge Exam: Blood pressure (!) 142/83, pulse 60, temperature 97.7 F (36.5 C), temperature source Oral, resp. rate 14, height 5\' 11"  (1.803 m), weight 127 kg (280 lb), SpO2 97 %.   General appearance: alert Head: Normocephalic, without obvious abnormality, atraumatic Eyes: conjunctivae/corneas clear. PERRL, EOM's intact. Fundi benign. Ears: normal TM's and external ear canals both ears Nose: Nares normal. Septum midline. Mucosa normal. No drainage or sinus tenderness. Throat: lips, mucosa, and tongue normal; teeth and gums normal Neck: no adenopathy, no carotid bruit, no JVD, supple, symmetrical, trachea midline and thyroid not enlarged, symmetric, no tenderness/mass/nodules Back: symmetric, no curvature. ROM normal. No CVA tenderness. Resp: clear to auscultation bilaterally Chest wall: no tenderness Cardio: regular rate and rhythm, S1, S2 normal, no murmur, Mancinelli, rub or gallop GI: soft, non-tender; bowel sounds normal; no masses,  no organomegaly Extremities: extremities normal, atraumatic, no cyanosis or edema Pulses: 2+ and symmetric Neurologic: Grossly normal Labs:   Lab Results  Component Value Date   WBC 5.3 06/12/2016   HGB 14.2 06/12/2016   HCT 41.6 06/12/2016   MCV 84.3 06/12/2016   PLT 138 (L) 06/12/2016   No results for input(s): NA, K, CL, CO2, BUN, CREATININE,  CALCIUM, PROT, BILITOT, ALKPHOS, ALT, AST, GLUCOSE in the last 168 hours.  Invalid input(s): LABALBU    Radiology: No pneumothorax EKG: Atrial pacing with ventricular regular sensing  FOLLOW UP PLANS AND APPOINTMENTS   Follow-up Information    Koury Roddy, MD Follow up in 1 week(s).   Specialty:  Cardiology Contact information: [facility] [place] [address] [phone]           BRING ALL MEDICATIONS WITH YOU TO FOLLOW UP APPOINTMENTS  Time spent with patient to include physician time: 25 minutes Signed:  Isaias Cowman MD, PhD, [facility] 06/29/2016, 8:06 AM

## 2016-07-03 NOTE — Anesthesia Postprocedure Evaluation (Signed)
Anesthesia Post Note  Patient: [name]  Procedure(s) Performed: Procedure(s) (LRB): INSERTION PACEMAKER (N/A)  Patient location during evaluation: PACU Anesthesia Type: General Level of consciousness: awake and alert Pain management: pain level controlled Vital Signs Assessment: post-procedure vital signs reviewed and stable Respiratory status: spontaneous breathing, nonlabored ventilation, respiratory function stable and patient connected to nasal cannula oxygen Cardiovascular status: blood pressure returned to baseline and stable Postop Assessment: no signs of nausea or vomiting Anesthetic complications: no    Last Vitals:  Vitals:   06/29/16 0546 06/29/16 0800  BP: 107/76 (!) 142/83  Pulse: 61 60  Resp: 18 14  Temp: 36.4 C 36.5 C    Last Pain:  Vitals:   06/29/16 0800  TempSrc: Oral  PainSc:                  [name]

## 2016-09-15 IMAGING — CR DG KNEE 1-2V PORT*L*
1 series · 2 of 2 positions shown · non-contrast
Comparison: None.

CLINICAL DATA: Postop left knee replacement

EXAM:
PORTABLE LEFT KNEE - 1-2 VIEW

[Series 1: ap · 0.17mm/px · 2 of 2 slices shown]
[im 1/2]
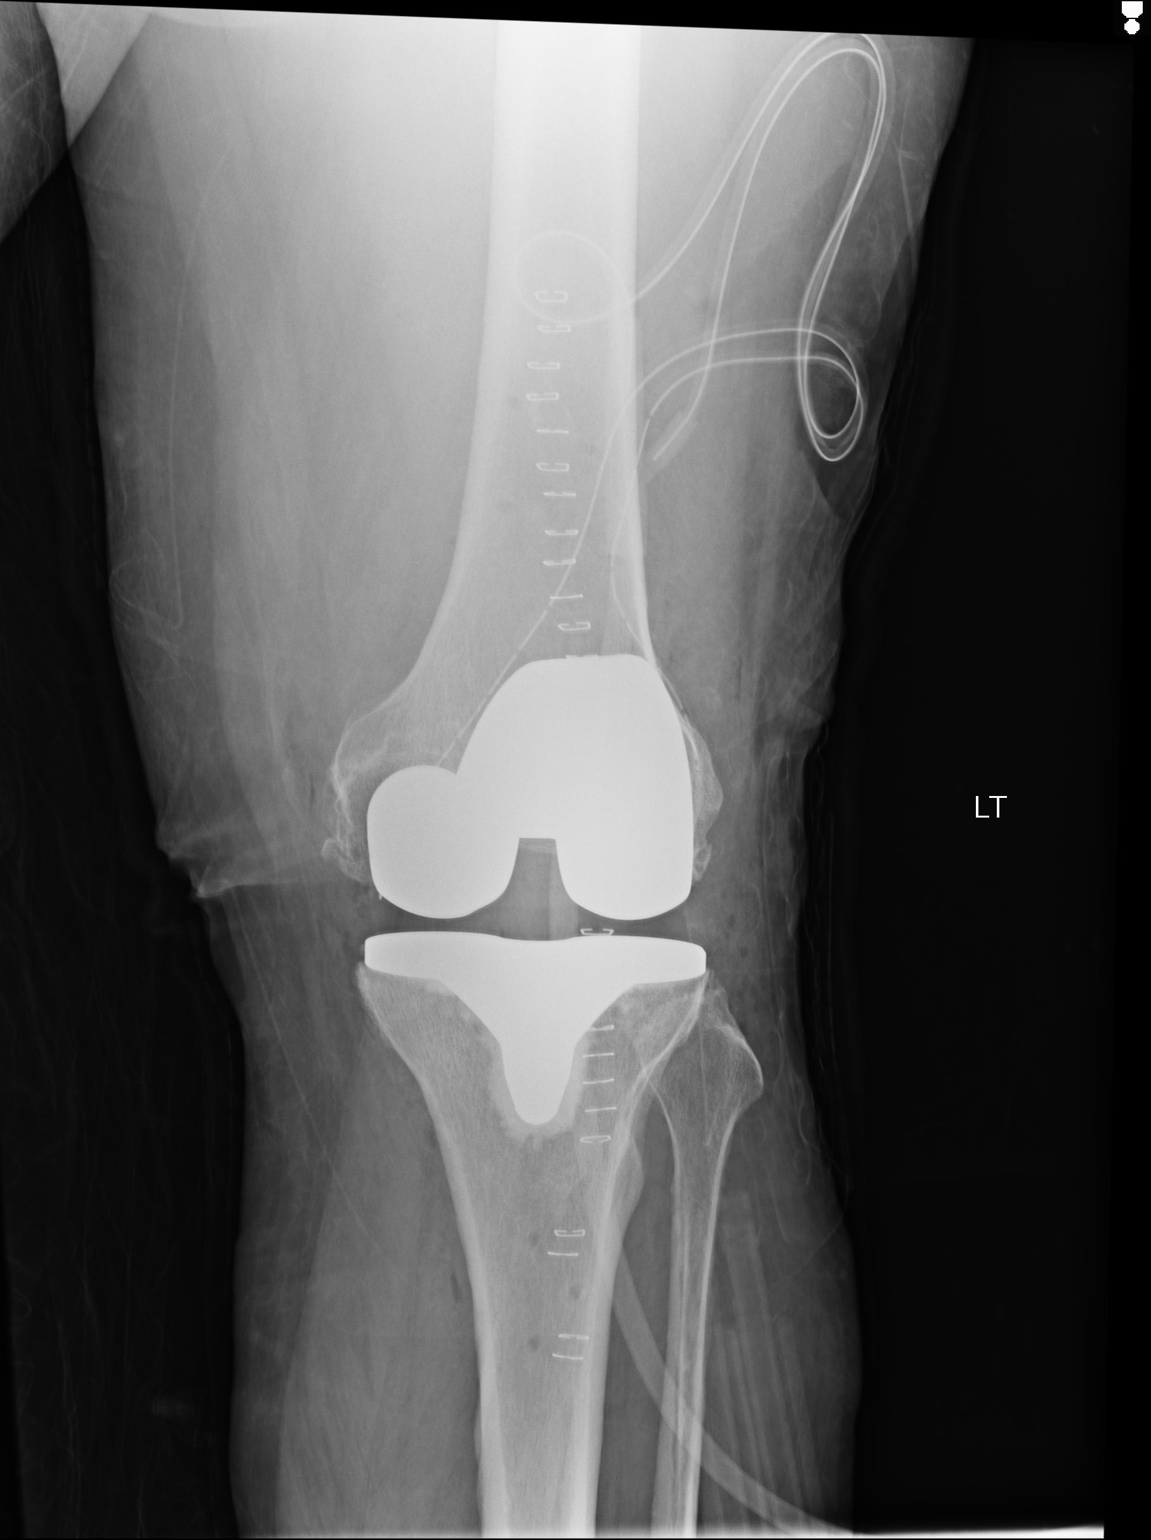
[im 2/2]
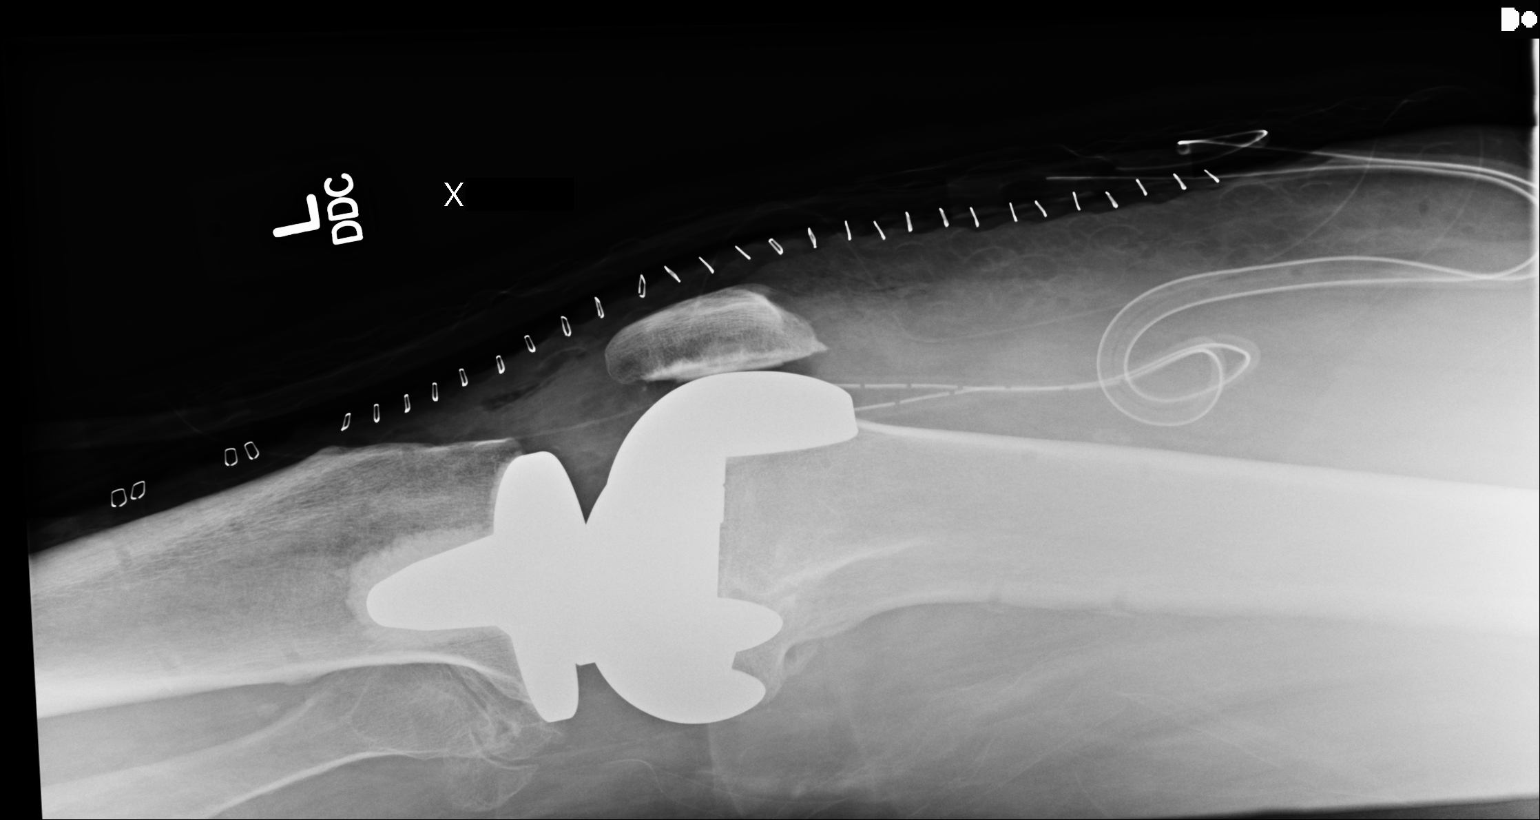

[2 of 2 positions shown; findings below may reference images not displayed]

FINDINGS: Changes of left knee replacement. No hardware or bony complicating
feature. Soft tissue drains in place.
IMPRESSION: Left knee replacement.  No complicating feature.

## 2017-02-26 DIAGNOSIS — Z950 Presence of cardiac pacemaker: Secondary | ICD-10-CM | POA: Insufficient documentation

## 2017-02-26 DIAGNOSIS — I480 Paroxysmal atrial fibrillation: Secondary | ICD-10-CM | POA: Insufficient documentation

## 2017-05-20 IMAGING — DX DG CHEST 1V PORT
1 series · 1 of 1 positions shown · non-contrast
Comparison: 06/12/2016

CLINICAL DATA: Status post pacemaker insertion

EXAM:
PORTABLE CHEST 1 VIEW

[chest ap]
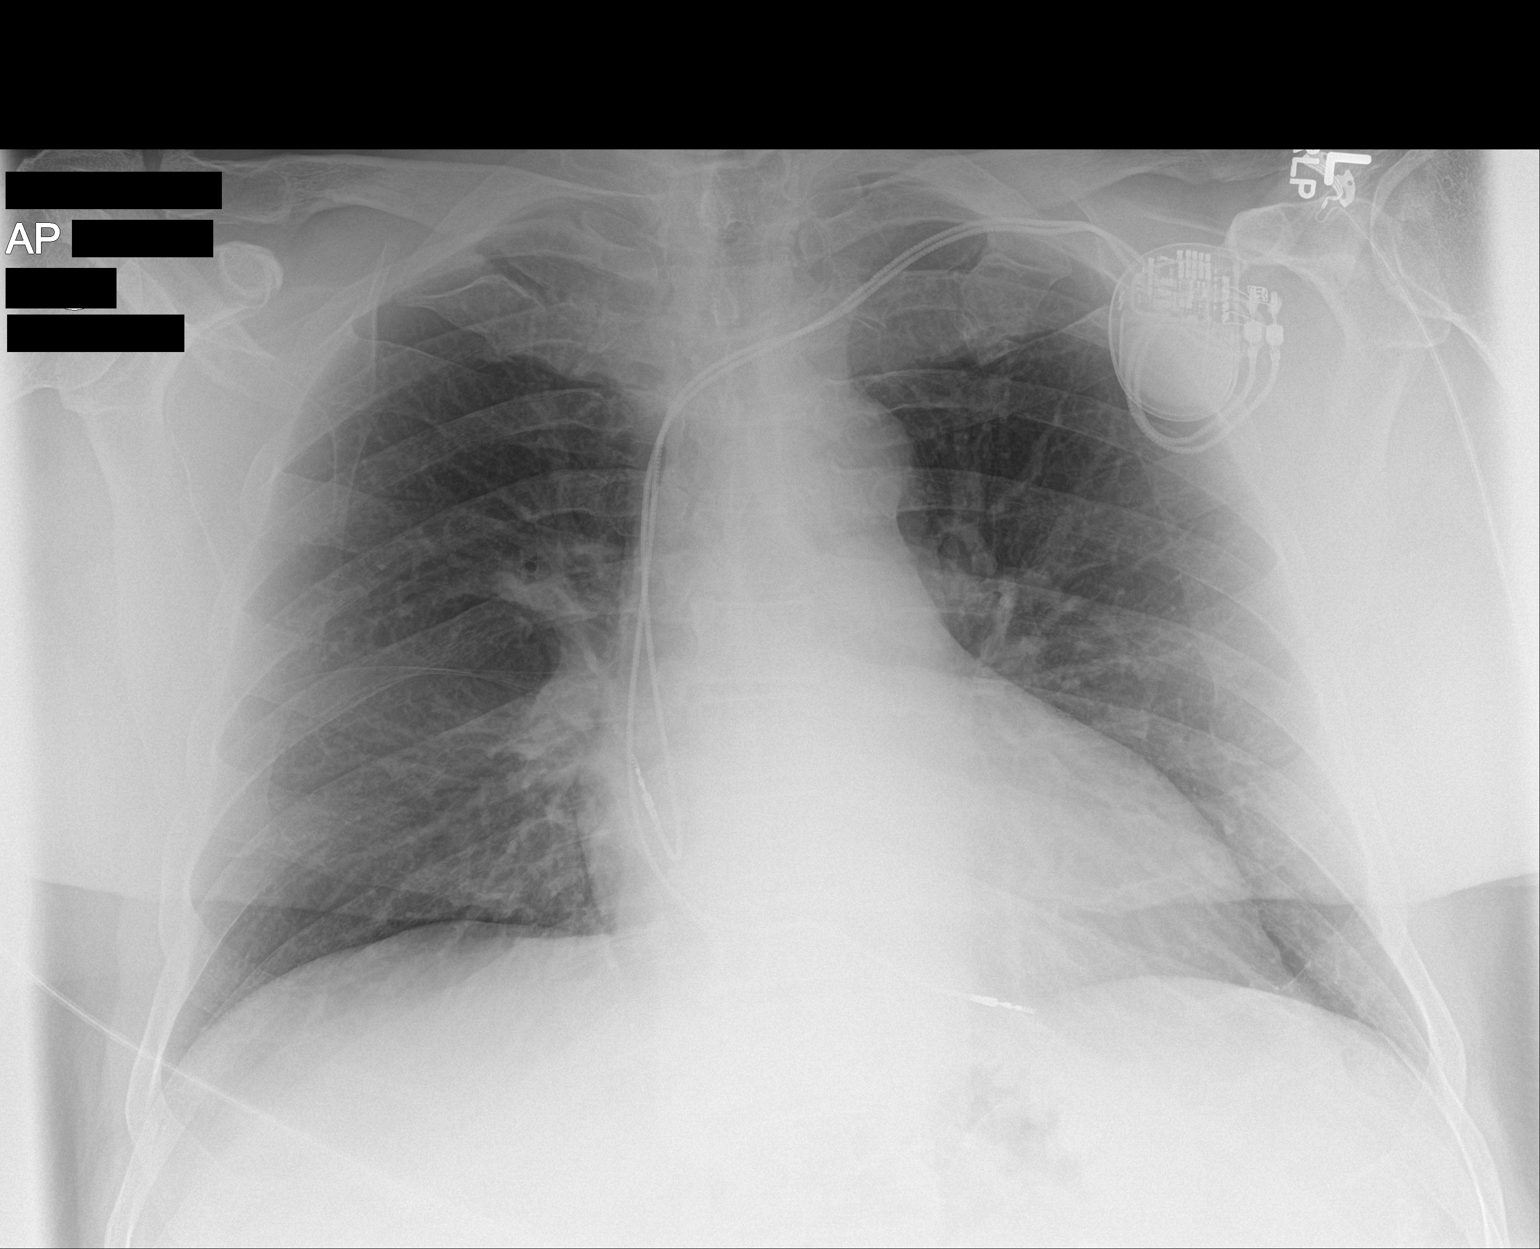

[1 of 1 positions shown; findings below may reference images not displayed]

FINDINGS: Dual-chamber pacer from the left with leads over the expected
location of right ventricle and right atrial appendage. No
mediastinal widening, evidence of pericardial effusion, or
pneumothorax.
IMPRESSION: No complicating features after dual-chamber pacer implant.

## 2017-10-09 ENCOUNTER — Other Ambulatory Visit: Payer: Self-pay | Admitting: Orthopedic Surgery

## 2017-10-09 DIAGNOSIS — M25511 Pain in right shoulder: Secondary | ICD-10-CM

## 2017-10-09 DIAGNOSIS — M25311 Other instability, right shoulder: Secondary | ICD-10-CM

## 2017-10-09 DIAGNOSIS — G8929 Other chronic pain: Secondary | ICD-10-CM

## 2017-10-09 DIAGNOSIS — M7551 Bursitis of right shoulder: Secondary | ICD-10-CM

## 2017-10-16 ENCOUNTER — Encounter (HOSPITAL_COMMUNITY): Payer: Self-pay | Admitting: Radiology

## 2017-10-16 ENCOUNTER — Ambulatory Visit (HOSPITAL_COMMUNITY)
Admission: RE | Admit: 2017-10-16 | Discharge: 2017-10-16 | Disposition: A | Discharge status: Home / Self Care | Payer: Medicare Other | Source: Ambulatory Visit | Attending: Orthopedic Surgery | Admitting: Orthopedic Surgery

## 2017-10-16 DIAGNOSIS — M25311 Other instability, right shoulder: Secondary | ICD-10-CM | POA: Insufficient documentation

## 2017-10-16 DIAGNOSIS — M25511 Pain in right shoulder: Secondary | ICD-10-CM | POA: Insufficient documentation

## 2017-10-16 DIAGNOSIS — M7551 Bursitis of right shoulder: Secondary | ICD-10-CM | POA: Diagnosis not present

## 2017-10-16 DIAGNOSIS — M25411 Effusion, right shoulder: Secondary | ICD-10-CM | POA: Diagnosis not present

## 2017-10-16 DIAGNOSIS — M19011 Primary osteoarthritis, right shoulder: Secondary | ICD-10-CM | POA: Diagnosis not present

## 2017-10-16 DIAGNOSIS — M7521 Bicipital tendinitis, right shoulder: Secondary | ICD-10-CM | POA: Insufficient documentation

## 2017-10-16 DIAGNOSIS — G8929 Other chronic pain: Secondary | ICD-10-CM | POA: Insufficient documentation

## 2017-10-31 DIAGNOSIS — M75121 Complete rotator cuff tear or rupture of right shoulder, not specified as traumatic: Secondary | ICD-10-CM | POA: Insufficient documentation

## 2017-10-31 DIAGNOSIS — M7521 Bicipital tendinitis, right shoulder: Secondary | ICD-10-CM | POA: Insufficient documentation

## 2017-10-31 DIAGNOSIS — M7581 Other shoulder lesions, right shoulder: Secondary | ICD-10-CM | POA: Insufficient documentation

## 2017-12-07 ENCOUNTER — Encounter
Admission: RE | Admit: 2017-12-07 | Discharge: 2017-12-07 | Disposition: A | Discharge status: Home / Self Care | Payer: Medicare Other | Source: Ambulatory Visit | Attending: Surgery | Admitting: Surgery

## 2017-12-07 ENCOUNTER — Other Ambulatory Visit: Payer: Self-pay

## 2017-12-07 DIAGNOSIS — Z950 Presence of cardiac pacemaker: Secondary | ICD-10-CM | POA: Insufficient documentation

## 2017-12-07 DIAGNOSIS — I10 Essential (primary) hypertension: Secondary | ICD-10-CM | POA: Insufficient documentation

## 2017-12-07 DIAGNOSIS — Z01810 Encounter for preprocedural cardiovascular examination: Secondary | ICD-10-CM | POA: Insufficient documentation

## 2017-12-07 HISTORY — DX: Presence of cardiac pacemaker: Z95.0

## 2017-12-07 NOTE — Patient Instructions (Signed)
Your procedure is scheduled on: December 18, 2017 TUESDAY Report to Day Surgery on the 2nd floor of the [facility]. To find out your arrival time, please call [phone] between 1PM - 3PM on: December 17, 2017 MONDAY  REMEMBER: Instructions that are not followed completely may result in serious medical risk, up to and including death; or upon the discretion of your surgeon and anesthesiologist your surgery may need to be rescheduled.  Do not eat food after midnight the night before your procedure.  No gum chewing, lozengers or hard candies.  You may however, drink CLEAR liquids up to 2 hours before you are scheduled to arrive for your surgery. Do not drink anything within 2 hours of the start of your surgery.  Clear liquids include: - water  - apple juice without pulp - clear gatorade - black coffee or tea (Do NOT add anything to the coffee or tea) Do NOT drink anything that is not on this list.  Type 1 and Type 2 diabetics should only drink water.  No Alcohol for 24 hours before or after surgery.  No Smoking including e-cigarettes for 24 hours prior to surgery.  No chewable tobacco products for at least 6 hours prior to surgery.  No nicotine patches on the day of surgery.  On the morning of surgery brush your teeth with toothpaste and water, you may rinse your mouth with mouthwash if you wish. Do not swallow any toothpaste or mouthwash.  Notify your doctor if there is any change in your medical condition (cold, fever, infection).  Do not wear jewelry, make-up, hairpins, clips or nail polish.  Do not wear lotions, powders, or perfumes. You may NOT wear deodorant.  Do not shave 48 hours prior to surgery. Men may shave face and neck.  Contacts and dentures may not be worn into surgery.  Do not bring valuables to the hospital, including drivers license, insurance or credit cards.  [facility] is not responsible for any belongings or valuables.   TAKE THESE MEDICATIONS THE  MORNING OF SURGERY: OMEPRAZOLE NIGHT BEFORE AND DOSE MORNING OF SURGERY  Use CHG Soap or wipes as directed on instruction sheet.  Bring your C-PAP to the hospital with you in case you may have to spend the night.   Follow recommendations from Cardiologist, Pulmonologist or PCP regarding stopping Aspirin, Coumadin, Plavix, Eliquis, Pradaxa, or Pletal. STOP 3 DAYS BEFORE SURGERY  Stop Anti-inflammatories (NSAIDS) such as Advil, Aleve, CELEBREX Ibuprofen, Motrin, Naproxen, Naprosyn and Aspirin based products such as Excedrin, Goodys Powder, BC Powder. 7 DAYS BEFORE SURGERY (May take Tylenol or Acetaminophen if needed.)  Stop ANY OVER THE COUNTER supplements until after surgery. 7 DAYS BEFORE SURGERY (May continue Vitamin D, Vitamin B, and multivitamin.)  Wear comfortable clothing (specific to your surgery type) to the hospital.  Plan for stool softeners for home use.  If you are being admitted to the hospital overnight, leave your suitcase in the car. After surgery it may be brought to your room.  If you are being discharged the day of surgery, you will not be allowed to drive home. You will need a responsible adult to drive you home and stay with you that night.   If you are taking public transportation, you will need to have a responsible adult with you. Please confirm with your physician that it is acceptable to use public transportation.   Please call [phone] if you have any questions about these instructions.

## 2017-12-10 NOTE — Pre-Procedure Instructions (Addendum)
EKG FAXED TO DR PARASCHOS TO ADDRESS  RECEIVED OK TO PROCEED AND PACEMAKER SHEET BACK FROM DR PARASCHOS

## 2017-12-14 IMAGING — US US EXTREM LOW VENOUS*L*
1 series · 13 of 24 positions shown · non-contrast
Comparison: None.

CLINICAL DATA: 64-year-old who underwent left total knee
arthroplasty on 10/25/2015, presenting now with bold acute onset of
left lower extremity swelling which began yesterday. Oral or



[Series 1: us extrem low venous*left* · 0.10mm/px · 13 of 32 slices shown]
[im 1/32]
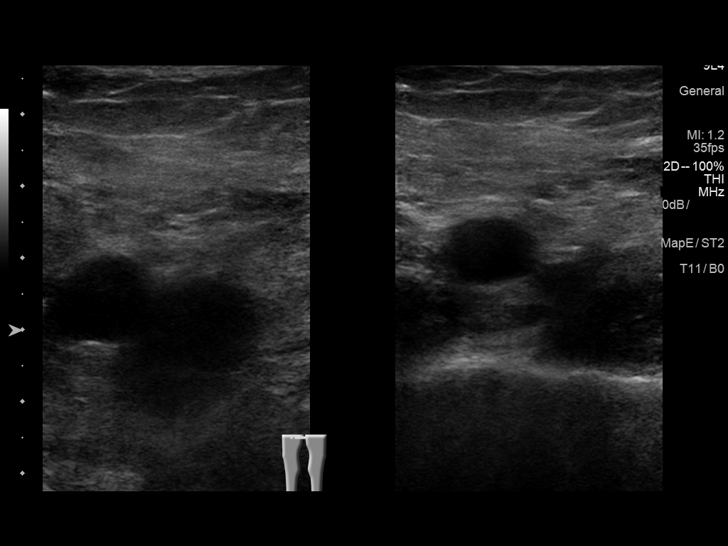
[im 3/32]
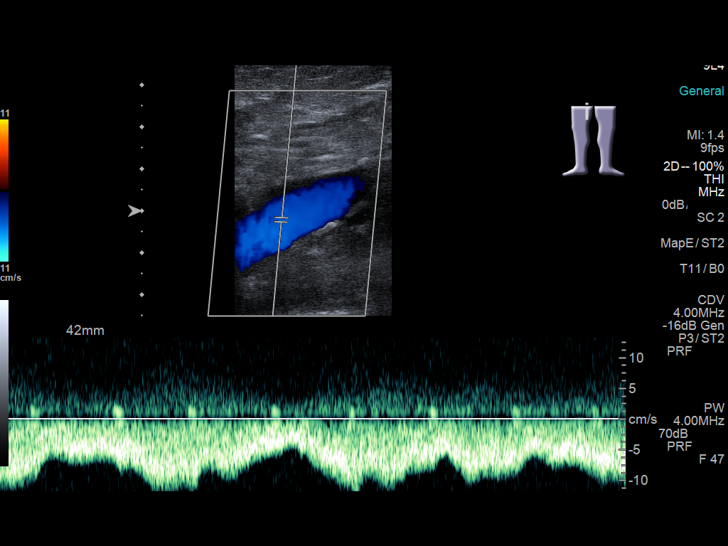
[im 6/32]
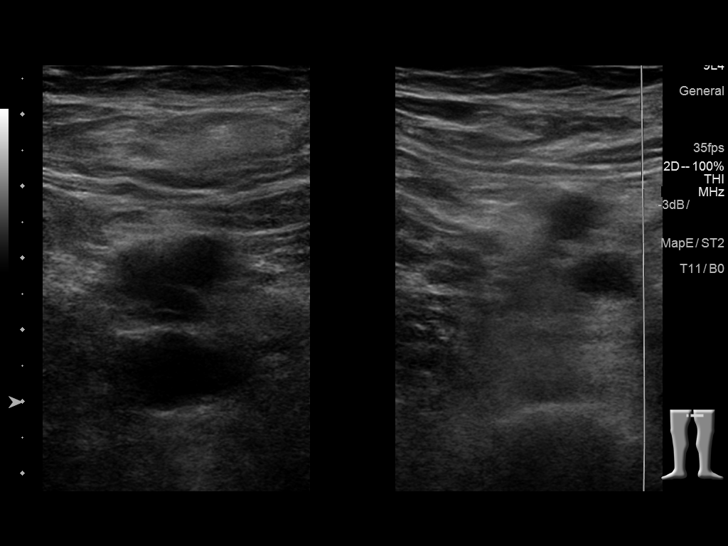
[im 9/32]
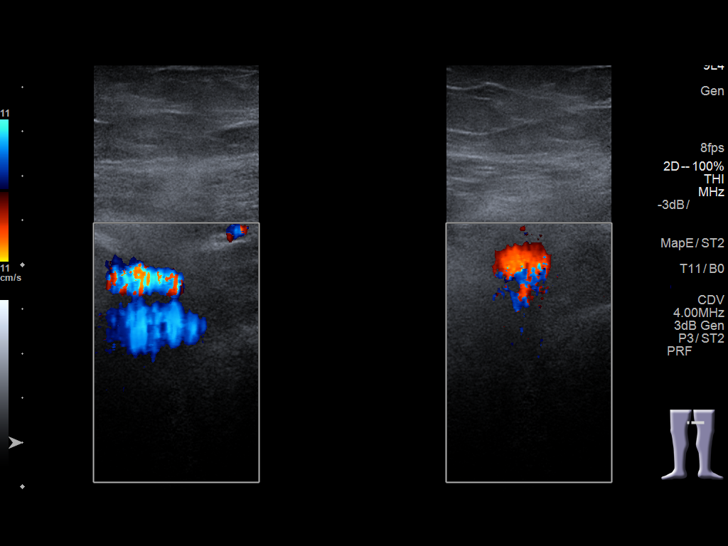
[im 11/32]
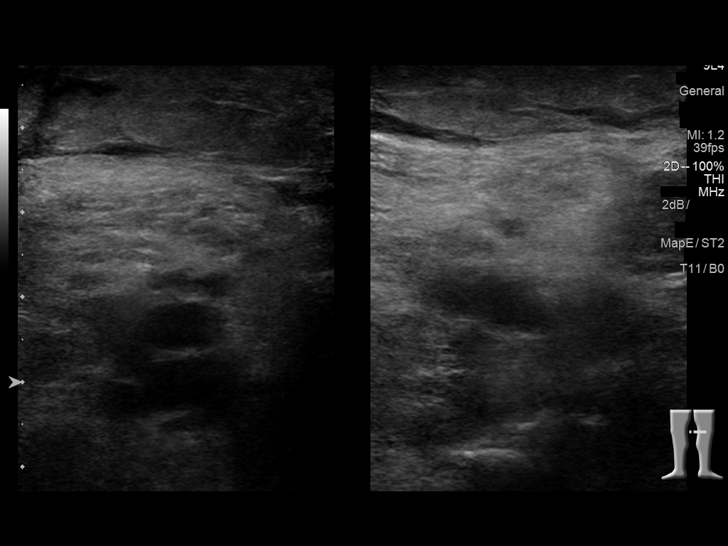
[im 14/32]
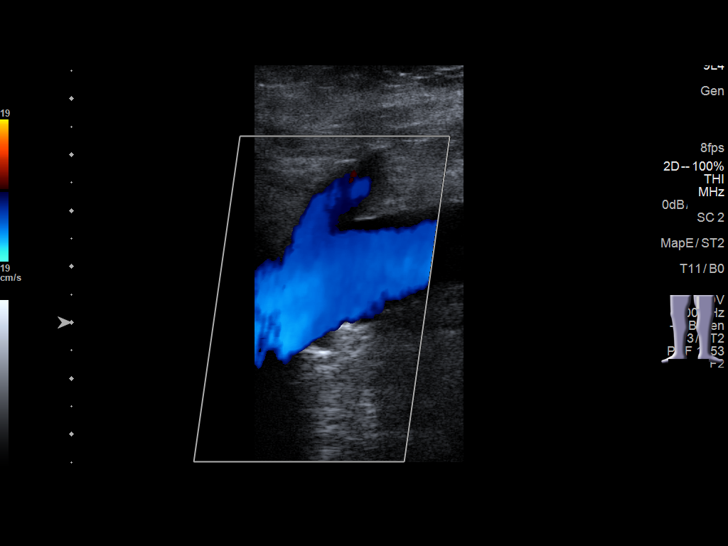
[im 17/32]
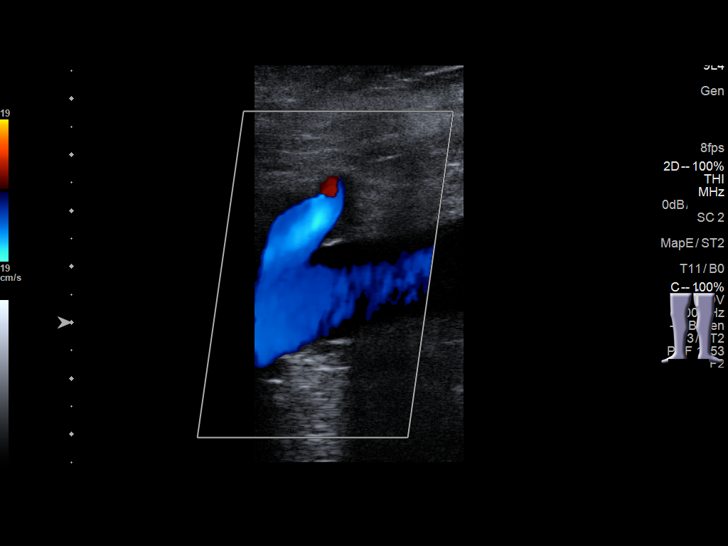
[im 18/32]
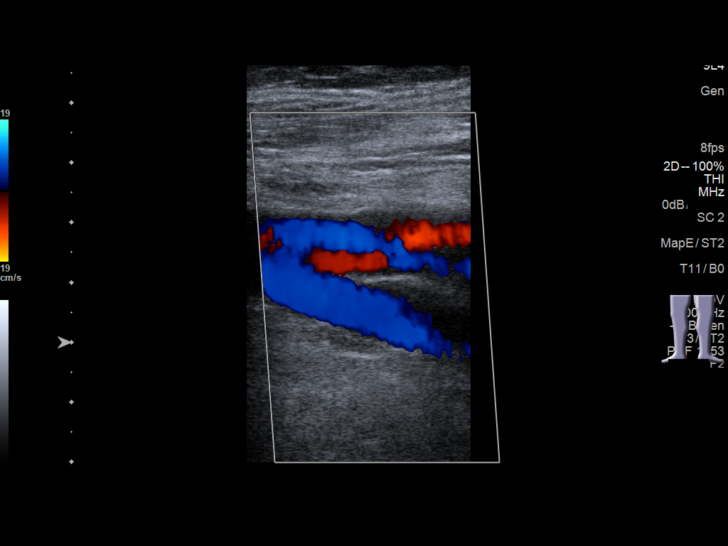
[im 21/32]
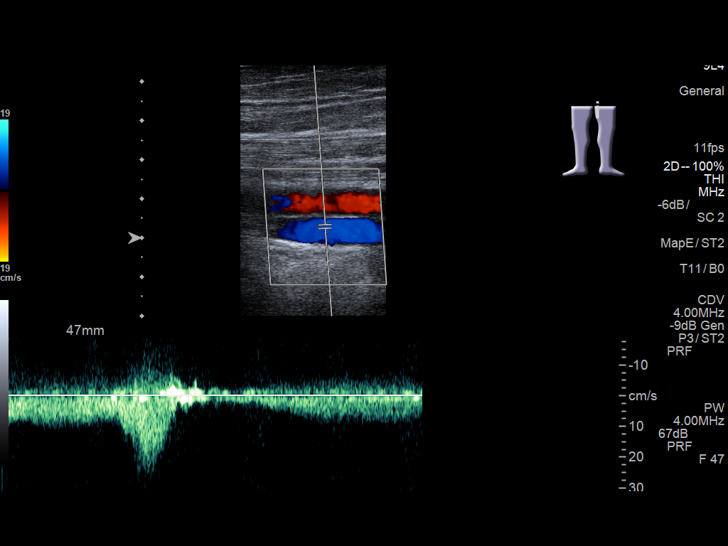
[im 23/32]
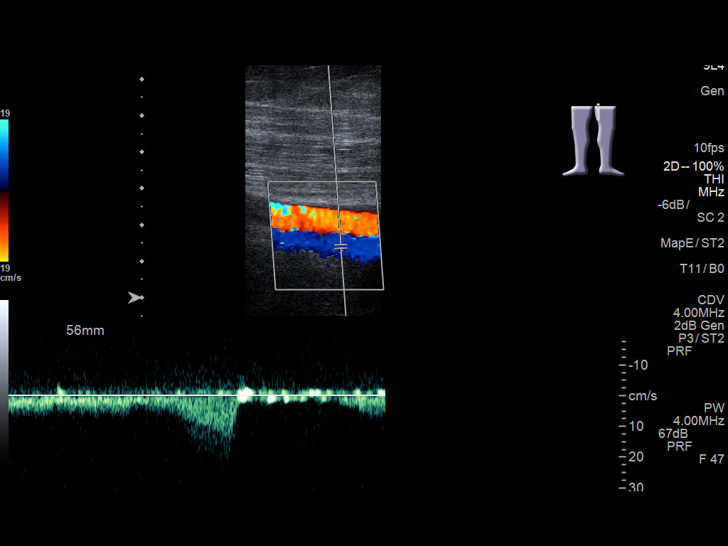
[im 26/32]
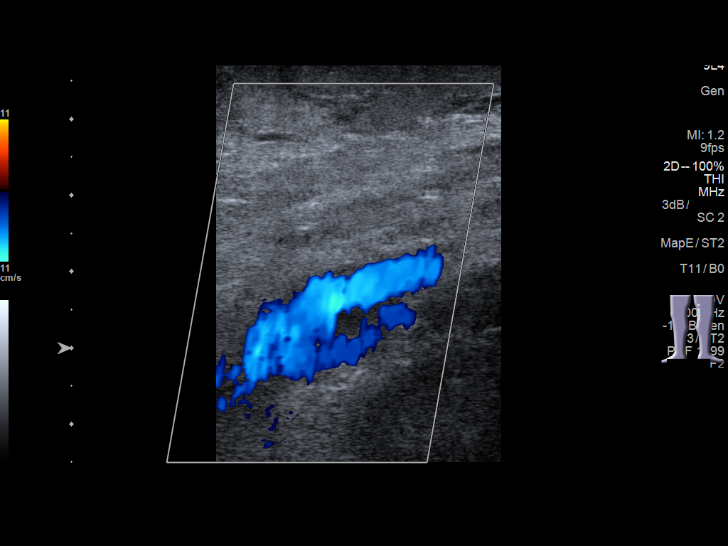
[im 29/32]
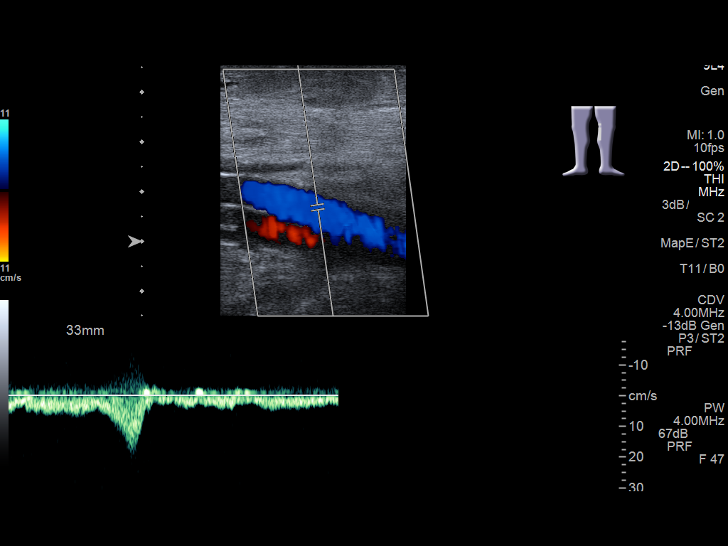
[im 32/32]
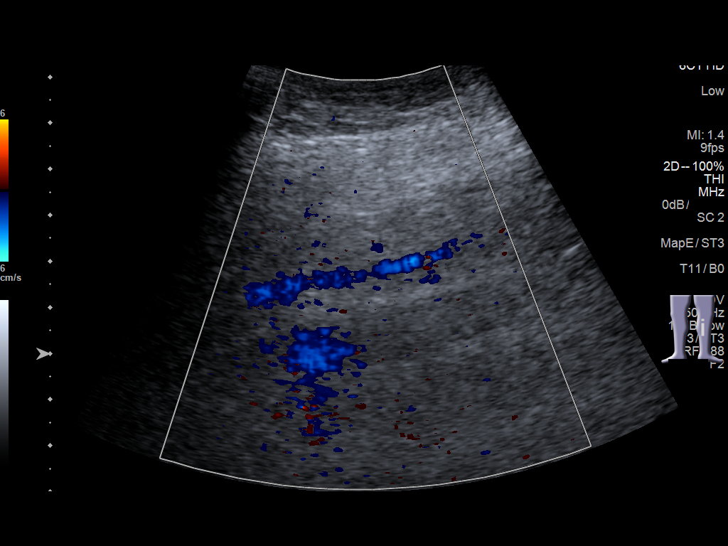

[13 of 24 positions shown; findings below may reference images not displayed]

FINDINGS: Contralateral Common Femoral Vein: Respiratory phasicity is normal
and symmetric with the symptomatic side. No evidence of thrombus.
Normal compressibility.

Common Femoral Vein: No evidence of thrombus. Normal
compressibility, respiratory phasicity and response to augmentation.

Saphenofemoral Junction: No evidence of thrombus. Normal
compressibility and flow on color Doppler imaging.

Profunda Femoral Vein: No evidence of thrombus. Normal
compressibility and flow on color Doppler imaging.

Femoral Vein: No evidence of thrombus. Normal compressibility,
respiratory phasicity and response to augmentation.

Popliteal Vein: No evidence of thrombus. Normal compressibility,
respiratory phasicity and response to augmentation.

Calf Veins: No evidence of thrombus. Normal compressibility and flow
on color Doppler imaging.

Superficial Great Saphenous Vein: Not evaluated.

Venous Reflux:  Not evaluated.

Other Findings:  None.
IMPRESSION: No evidence of left lower extremity DVT.  Or

## 2017-12-17 MED ORDER — CEFAZOLIN SODIUM-DEXTROSE 2-4 GM/100ML-% IV SOLN
2.0000 g | Freq: Once | INTRAVENOUS | Status: AC
  Administered 2017-12-18: 3 g via INTRAVENOUS

## 2017-12-18 ENCOUNTER — Ambulatory Visit: Payer: Medicare Other | Admitting: Certified Registered"

## 2017-12-18 ENCOUNTER — Encounter
Admission: RE | Disposition: A | Discharge status: Home / Self Care | Payer: Self-pay | Source: Ambulatory Visit | Attending: Surgery

## 2017-12-18 ENCOUNTER — Other Ambulatory Visit: Payer: Self-pay

## 2017-12-18 ENCOUNTER — Ambulatory Visit
Admission: RE | Admit: 2017-12-18 | Discharge: 2017-12-18 | Disposition: A | Discharge status: Home / Self Care | Payer: Medicare Other | Source: Ambulatory Visit | Attending: Surgery | Admitting: Surgery

## 2017-12-18 ENCOUNTER — Encounter: Payer: Self-pay | Admitting: *Deleted

## 2017-12-18 DIAGNOSIS — Z7901 Long term (current) use of anticoagulants: Secondary | ICD-10-CM | POA: Insufficient documentation

## 2017-12-18 DIAGNOSIS — G4730 Sleep apnea, unspecified: Secondary | ICD-10-CM | POA: Insufficient documentation

## 2017-12-18 DIAGNOSIS — M7581 Other shoulder lesions, right shoulder: Secondary | ICD-10-CM | POA: Diagnosis not present

## 2017-12-18 DIAGNOSIS — Z950 Presence of cardiac pacemaker: Secondary | ICD-10-CM | POA: Insufficient documentation

## 2017-12-18 DIAGNOSIS — Z87891 Personal history of nicotine dependence: Secondary | ICD-10-CM | POA: Diagnosis not present

## 2017-12-18 DIAGNOSIS — M7541 Impingement syndrome of right shoulder: Secondary | ICD-10-CM | POA: Diagnosis present

## 2017-12-18 DIAGNOSIS — M75121 Complete rotator cuff tear or rupture of right shoulder, not specified as traumatic: Secondary | ICD-10-CM | POA: Diagnosis not present

## 2017-12-18 DIAGNOSIS — I4891 Unspecified atrial fibrillation: Secondary | ICD-10-CM | POA: Diagnosis not present

## 2017-12-18 DIAGNOSIS — K219 Gastro-esophageal reflux disease without esophagitis: Secondary | ICD-10-CM | POA: Diagnosis not present

## 2017-12-18 HISTORY — PX: SHOULDER ARTHROSCOPY WITH OPEN ROTATOR CUFF REPAIR: SHX6092

## 2017-12-18 SURGERY — ARTHROSCOPY, SHOULDER WITH REPAIR, ROTATOR CUFF, OPEN
Anesthesia: General | Laterality: Right

## 2017-12-18 MED ORDER — DEXAMETHASONE SODIUM PHOSPHATE 10 MG/ML IJ SOLN
INTRAMUSCULAR | Status: DC | PRN
  Administered 2017-12-18: 10 mg via INTRAVENOUS

## 2017-12-18 MED ORDER — LIDOCAINE HCL (CARDIAC) 20 MG/ML IV SOLN
INTRAVENOUS | Status: DC | PRN
  Administered 2017-12-18: 50 mg via INTRAVENOUS

## 2017-12-18 MED ORDER — OXYCODONE HCL 5 MG PO TABS: 5.0000 mg | ORAL_TABLET | ORAL | 0 refills | Status: DC | PRN

## 2017-12-18 MED ORDER — FENTANYL CITRATE (PF) 100 MCG/2ML IJ SOLN
25.0000 ug | INTRAMUSCULAR | Status: DC | PRN
Start: 2017-12-18 — End: 2017-12-18

## 2017-12-18 MED ORDER — GLYCOPYRROLATE 0.2 MG/ML IJ SOLN
INTRAMUSCULAR | Status: DC | PRN
  Administered 2017-12-18: 0.1 mg via INTRAVENOUS

## 2017-12-18 MED ORDER — MIDAZOLAM HCL 2 MG/2ML IJ SOLN
INTRAMUSCULAR | Status: DC | PRN
  Administered 2017-12-18: 2 mg via INTRAVENOUS

## 2017-12-18 MED ORDER — METOCLOPRAMIDE HCL 10 MG PO TABS: 5.0000 mg | ORAL_TABLET | Freq: Three times a day (TID) | ORAL | Status: DC | PRN

## 2017-12-18 MED ORDER — EPINEPHRINE PF 1 MG/ML IJ SOLN
INTRAMUSCULAR | Status: AC
  Filled 2017-12-18: qty 2

## 2017-12-18 MED ORDER — ONDANSETRON HCL 4 MG/2ML IJ SOLN
INTRAMUSCULAR | Status: AC
  Filled 2017-12-18: qty 2

## 2017-12-18 MED ORDER — SODIUM CHLORIDE 0.9 % IV SOLN
INTRAVENOUS | Status: DC | PRN
  Administered 2017-12-18: 50 ug/min via INTRAVENOUS

## 2017-12-18 MED ORDER — BUPIVACAINE-EPINEPHRINE (PF) 0.5% -1:200000 IJ SOLN
INTRAMUSCULAR | Status: AC
  Filled 2017-12-18: qty 30

## 2017-12-18 MED ORDER — MIDAZOLAM HCL 2 MG/2ML IJ SOLN
INTRAMUSCULAR | Status: AC
  Filled 2017-12-18: qty 2

## 2017-12-18 MED ORDER — ONDANSETRON HCL 4 MG PO TABS: 4.0000 mg | ORAL_TABLET | Freq: Four times a day (QID) | ORAL | Status: DC | PRN

## 2017-12-18 MED ORDER — ONDANSETRON HCL 4 MG/2ML IJ SOLN: 4.0000 mg | Freq: Once | INTRAMUSCULAR | Status: DC | PRN

## 2017-12-18 MED ORDER — FENTANYL CITRATE (PF) 100 MCG/2ML IJ SOLN
INTRAMUSCULAR | Status: AC
  Administered 2017-12-18: 50 ug via INTRAVENOUS
  Filled 2017-12-18: qty 2

## 2017-12-18 MED ORDER — PHENYLEPHRINE HCL 10 MG/ML IJ SOLN
INTRAMUSCULAR | Status: DC | PRN
  Administered 2017-12-18: 80 ug via INTRAVENOUS

## 2017-12-18 MED ORDER — INSULIN ASPART 100 UNIT/ML ~~LOC~~ SOLN
SUBCUTANEOUS | Status: AC
  Filled 2017-12-18: qty 1

## 2017-12-18 MED ORDER — SUGAMMADEX SODIUM 500 MG/5ML IV SOLN
INTRAVENOUS | Status: AC
  Filled 2017-12-18: qty 5

## 2017-12-18 MED ORDER — OXYCODONE HCL 5 MG PO TABS: 5.0000 mg | ORAL_TABLET | ORAL | Status: DC | PRN

## 2017-12-18 MED ORDER — ONDANSETRON HCL 4 MG/2ML IJ SOLN: 4.0000 mg | Freq: Four times a day (QID) | INTRAMUSCULAR | Status: DC | PRN

## 2017-12-18 MED ORDER — ACETAMINOPHEN 10 MG/ML IV SOLN
INTRAVENOUS | Status: DC | PRN
  Administered 2017-12-18: 1000 mg via INTRAVENOUS

## 2017-12-18 MED ORDER — MIDAZOLAM HCL 2 MG/2ML IJ SOLN
INTRAMUSCULAR | Status: AC
  Administered 2017-12-18: 2 mg via INTRAVENOUS
  Filled 2017-12-18: qty 2

## 2017-12-18 MED ORDER — EPINEPHRINE PF 1 MG/ML IJ SOLN
INTRAMUSCULAR | Status: AC
  Filled 2017-12-18: qty 1

## 2017-12-18 MED ORDER — GLYCOPYRROLATE 0.2 MG/ML IJ SOLN
INTRAMUSCULAR | Status: AC
  Filled 2017-12-18: qty 1

## 2017-12-18 MED ORDER — PROPOFOL 10 MG/ML IV BOLUS
INTRAVENOUS | Status: AC
  Filled 2017-12-18: qty 20

## 2017-12-18 MED ORDER — SUGAMMADEX SODIUM 500 MG/5ML IV SOLN
INTRAVENOUS | Status: DC | PRN
Start: 2017-12-18 — End: 2017-12-18
  Administered 2017-12-18: 260 mg via INTRAVENOUS

## 2017-12-18 MED ORDER — FENTANYL CITRATE (PF) 100 MCG/2ML IJ SOLN
INTRAMUSCULAR | Status: DC | PRN
  Administered 2017-12-18: 100 ug via INTRAVENOUS

## 2017-12-18 MED ORDER — ONDANSETRON HCL 4 MG/2ML IJ SOLN
INTRAMUSCULAR | Status: DC | PRN
  Administered 2017-12-18: 4 mg via INTRAVENOUS

## 2017-12-18 MED ORDER — POTASSIUM CHLORIDE IN NACL 20-0.9 MEQ/L-% IV SOLN: INTRAVENOUS | Status: DC

## 2017-12-18 MED ORDER — PROPOFOL 10 MG/ML IV BOLUS
INTRAVENOUS | Status: DC | PRN
  Administered 2017-12-18: 200 mg via INTRAVENOUS

## 2017-12-18 MED ORDER — METOCLOPRAMIDE HCL 5 MG/ML IJ SOLN: 5.0000 mg | Freq: Three times a day (TID) | INTRAMUSCULAR | Status: DC | PRN

## 2017-12-18 MED ORDER — BUPIVACAINE-EPINEPHRINE 0.5% -1:200000 IJ SOLN
INTRAMUSCULAR | Status: DC | PRN
Start: 2017-12-18 — End: 2017-12-18
  Administered 2017-12-18: 30 mL

## 2017-12-18 MED ORDER — LACTATED RINGERS IV SOLN
INTRAVENOUS | Status: DC | PRN
  Administered 2017-12-18: 1000 mL
  Administered 2017-12-18: 16:00:00 via INTRAVENOUS

## 2017-12-18 MED ORDER — LIDOCAINE HCL (PF) 2 % IJ SOLN
INTRAMUSCULAR | Status: AC
  Filled 2017-12-18: qty 10

## 2017-12-18 MED ORDER — LACTATED RINGERS IV SOLN
INTRAVENOUS | Status: DC | PRN
  Administered 2017-12-18: 2 mL

## 2017-12-18 MED ORDER — LIDOCAINE HCL (PF) 1 % IJ SOLN
INTRAMUSCULAR | Status: DC | PRN
  Administered 2017-12-18: .8 mL via SUBCUTANEOUS

## 2017-12-18 MED ORDER — ROCURONIUM BROMIDE 100 MG/10ML IV SOLN
INTRAVENOUS | Status: DC | PRN
  Administered 2017-12-18: 50 mg via INTRAVENOUS
  Administered 2017-12-18: 15 mg via INTRAVENOUS

## 2017-12-18 MED ORDER — ROPIVACAINE HCL 5 MG/ML IJ SOLN
INTRAMUSCULAR | Status: DC | PRN
  Administered 2017-12-18: 30 mL via PERINEURAL

## 2017-12-18 MED ORDER — PHENYLEPHRINE HCL 10 MG/ML IJ SOLN
INTRAMUSCULAR | Status: AC
  Filled 2017-12-18: qty 1

## 2017-12-18 MED ORDER — FENTANYL CITRATE (PF) 100 MCG/2ML IJ SOLN
INTRAMUSCULAR | Status: AC
  Filled 2017-12-18: qty 2

## 2017-12-18 MED ORDER — LIDOCAINE HCL (PF) 1 % IJ SOLN
INTRAMUSCULAR | Status: AC
  Filled 2017-12-18: qty 5

## 2017-12-18 MED ORDER — DEXAMETHASONE SODIUM PHOSPHATE 10 MG/ML IJ SOLN
INTRAMUSCULAR | Status: AC
  Filled 2017-12-18: qty 1

## 2017-12-18 MED ORDER — MIDAZOLAM HCL 2 MG/2ML IJ SOLN
2.0000 mg | Freq: Once | INTRAMUSCULAR | Status: AC
  Administered 2017-12-18: 2 mg via INTRAVENOUS

## 2017-12-18 MED ORDER — ACETAMINOPHEN 10 MG/ML IV SOLN
INTRAVENOUS | Status: AC
  Filled 2017-12-18: qty 100

## 2017-12-18 MED ORDER — ROCURONIUM BROMIDE 50 MG/5ML IV SOLN
INTRAVENOUS | Status: AC
  Filled 2017-12-18: qty 1

## 2017-12-18 MED ORDER — FENTANYL CITRATE (PF) 100 MCG/2ML IJ SOLN
50.0000 ug | Freq: Once | INTRAMUSCULAR | Status: AC
  Administered 2017-12-18: 50 ug via INTRAVENOUS

## 2017-12-18 MED ORDER — ROPIVACAINE HCL 5 MG/ML IJ SOLN
INTRAMUSCULAR | Status: AC
  Filled 2017-12-18: qty 30

## 2017-12-18 SURGICAL SUPPLY — 42 items
ANCHOR JUGGERKNOT WTAP NDL 2.9 (Anchor) ×6 IMPLANT
ANCHOR SUT QUATTRO KNTLS 4.5 (Anchor) ×4 IMPLANT
BIT DRILL JUGRKNT W/NDL BIT2.9 (DRILL) IMPLANT
BLADE FULL RADIUS 3.5 (BLADE) ×3 IMPLANT
BUR ACROMIONIZER 4.0 (BURR) ×1 IMPLANT
CANNULA SHAVER 8MMX76MM (CANNULA) ×3 IMPLANT
CHLORAPREP W/TINT 26ML (MISCELLANEOUS) ×3 IMPLANT
COVER MAYO STAND STRL (DRAPES) ×3 IMPLANT
DRAPE IMP U-DRAPE 54X76 (DRAPES) ×6 IMPLANT
DRILL JUGGERKNOT W/NDL BIT 2.9 (DRILL) ×3
ELECT REM PT RETURN 9FT ADLT (ELECTROSURGICAL) ×3
ELECTRODE REM PT RTRN 9FT ADLT (ELECTROSURGICAL) ×1 IMPLANT
GAUZE PETRO XEROFOAM 1X8 (MISCELLANEOUS) ×3 IMPLANT
GAUZE SPONGE 4X4 12PLY STRL (GAUZE/BANDAGES/DRESSINGS) ×3 IMPLANT
GLOVE BIO SURGEON STRL SZ7.5 (GLOVE) ×6 IMPLANT
GLOVE BIO SURGEON STRL SZ8 (GLOVE) ×6 IMPLANT
GLOVE BIOGEL PI IND STRL 8 (GLOVE) ×1 IMPLANT
GLOVE BIOGEL PI INDICATOR 8 (GLOVE) ×2
GLOVE INDICATOR 8.0 STRL GRN (GLOVE) ×3 IMPLANT
GOWN STRL REUS W/ TWL LRG LVL3 (GOWN DISPOSABLE) ×1 IMPLANT
GOWN STRL REUS W/ TWL XL LVL3 (GOWN DISPOSABLE) ×1 IMPLANT
GOWN STRL REUS W/TWL LRG LVL3 (GOWN DISPOSABLE) ×2
GOWN STRL REUS W/TWL XL LVL3 (GOWN DISPOSABLE) ×2
GRASPER SUT 15 45D LOW PRO (SUTURE) IMPLANT
IV LACTATED RINGER IRRG 3000ML (IV SOLUTION) ×4
IV LR IRRIG 3000ML ARTHROMATIC (IV SOLUTION) ×2 IMPLANT
MANIFOLD NEPTUNE II (INSTRUMENTS) ×3 IMPLANT
MASK FACE SPIDER DISP (MASK) ×3 IMPLANT
MAT BLUE FLOOR 46X72 FLO (MISCELLANEOUS) ×3 IMPLANT
PACK ARTHROSCOPY SHOULDER (MISCELLANEOUS) ×3 IMPLANT
SLING ARM LRG DEEP (SOFTGOODS) ×1 IMPLANT
SLING ULTRA II LG (MISCELLANEOUS) ×3 IMPLANT
STAPLER SKIN PROX 35W (STAPLE) ×3 IMPLANT
STRAP SAFETY 5IN WIDE (MISCELLANEOUS) ×3 IMPLANT
SUT ETHIBOND 0 MO6 C/R (SUTURE) ×3 IMPLANT
SUT VIC AB 2-0 CT1 27 (SUTURE) ×4
SUT VIC AB 2-0 CT1 TAPERPNT 27 (SUTURE) ×2 IMPLANT
TAPE MICROFOAM 4IN (TAPE) ×3 IMPLANT
TUBING ARTHRO INFLOW-ONLY STRL (TUBING) ×3 IMPLANT
TUBING CONNECTING 10 (TUBING) ×2 IMPLANT
TUBING CONNECTING 10' (TUBING) ×1
WAND HAND CNTRL MULTIVAC 90 (MISCELLANEOUS) ×3 IMPLANT

## 2017-12-18 NOTE — Transfer of Care (Signed)
Immediate Anesthesia Transfer of Care Note  Patient: Kinnie Kaupp Horgan  Procedure(s) Performed: SHOULDER ARTHROSCOPY WITH OPEN ROTATOR CUFF REPAIR (Right )  Patient Location: PACU  Anesthesia Type:General  Level of Consciousness: sedated  Airway & Oxygen Therapy: Patient Spontanous Breathing and Patient connected to face mask oxygen  Post-op Assessment: Report given to RN and Post -op Vital signs reviewed and stable  Post vital signs: Reviewed and stable  Last Vitals:  Vitals Value Taken Time  BP 116/75 12/18/2017  4:15 PM  Temp 36.3 C 12/18/2017  4:15 PM  Pulse 60 12/18/2017  4:18 PM  Resp 19 12/18/2017  4:18 PM  SpO2 100 % 12/18/2017  4:18 PM  Vitals shown include unvalidated device data.  Last Pain:  Vitals:   12/18/17 1330  TempSrc:   PainSc: 0-No pain         Complications: No apparent anesthesia complications

## 2017-12-18 NOTE — Anesthesia Postprocedure Evaluation (Signed)
Anesthesia Post Note  Patient: [name]  Procedure(s) Performed: SHOULDER ARTHROSCOPY WITH OPEN ROTATOR CUFF REPAIR (Right )  Patient location during evaluation: PACU Anesthesia Type: General Level of consciousness: awake and alert and oriented Pain management: pain level controlled Vital Signs Assessment: post-procedure vital signs reviewed and stable Respiratory status: spontaneous breathing Cardiovascular status: blood pressure returned to baseline Anesthetic complications: no     Last Vitals:  Vitals:   12/18/17 1713 12/18/17 1737  BP: 129/72 (!) 142/82  Pulse: 68 61  Resp: 16 16  Temp: (!) 36.3 C 36.7 C  SpO2: 96% 97%    Last Pain:  Vitals:   12/18/17 1737  TempSrc: Temporal  PainSc: 0-No pain                 Mariajose Mow

## 2017-12-18 NOTE — Op Note (Signed)
12/18/2017  3:52 PM  Patient:   [name]  Pre-Op Diagnosis:   Impingement/tendinopathy with rotator cuff tear and biceps tendinopathy, right shoulder.  Post-Op Diagnosis: Impingement/tendinopathy with rotator cuff tear and biceps tendinopathy, right shoulder.  Procedure: Limited arthroscopic debridement, arthroscopic subacromial decompression, mini-open rotator cuff repair, and mini-open biceps tenodesis, right shoulder.  Anesthesia: General endotracheal with interscalene block placed preoperatively by the anesthesiologist.  Surgeon:   Pascal Lux, MD  Assistant:   Cameron Proud, PA-C  Findings: As above. There was a full-thickness tear involving the mid and posterior portions of the supraspinatus extending posterior medially into the infraspinatus tendon. The remainder of the rotator cuff was in satisfactory condition. There was mild labral fraying superiorly and anteriorly without frank detachment from the glenoid. There was significant tendinopathy with partial-thickness tearing of the biceps tendon. The articular surfaces of the glenoid and humerus both were in excellent condition.   Complications: None  Fluids:   1000 cc  Estimated blood loss: 5 cc  Tourniquet time: None  Drains: None  Closure: Staples   Brief clinical note: The patient is a 67 year old male with a history of right shoulder pain following a fall approximately 11 months ago. The patient's symptoms have progressed despite medications, activity modification, etc. The patient's history and examination are consistent with impingement/tendinopathy with a rotator cuff tear. These findings were confirmed by MRI scan. The patient presents at this time for definitive management of these shoulder symptoms.  Procedure: The patient underwent placement of an interscalene block by the anesthesiologist in the preoperative holding area before being brought into the operating room and lain in  the supine position. The patient then underwent general endotracheal intubation and anesthesia before being repositioned in the beach chair position using the beach chair positioner. The right shoulder and upper extremity were prepped with ChloraPrep solution before being draped sterilely. Preoperative antibiotics were administered. A timeout was performed to confirm the proper surgical site before the expected portal sites and incision site were injected with 0.5% Sensorcaine with epinephrine. A posterior portal was created and the glenohumeral joint thoroughly inspected with the findings as described above. An anterior portal was created using an outside-in technique. The labrum and rotator cuff were further probed, again confirming the above-noted findings. The areas of labral fraying and synovitis were debrided back to stable margins using the full-radius resector, as were the torn margins of the rotator cuff and biceps tendon. The ArthroCare wand was inserted and used to release the biceps tendon from its labral anchor. It also was used to obtain hemostasis as well as to "anneal" the labrum superiorly and anteriorly. The instruments were removed from the joint after suctioning the excess fluid.  The camera was repositioned through the posterior portal into the subacromial space. A separate lateral portal was created using an outside-in technique. The 3.5 mm full-radius resector was introduced and used to perform a subtotal bursectomy. The ArthroCare wand was then inserted and used to remove the periosteal tissue off the undersurface of the anterior third of the acromion as well as to recess the coracoacromial ligament from its attachment along the anterior and lateral margins of the acromion. The 4.0 mm acromionizing bur was introduced and used to complete the decompression by removing the undersurface of the anterior third of the acromion. The full radius resector was reintroduced to remove any residual bony  debris before the ArthroCare wand was reintroduced to obtain hemostasis. The instruments were then removed from the subacromial space after suctioning  the excess fluid.  An approximately 4-5 cm incision was made over the anterolateral aspect of the shoulder beginning at the anterolateral corner of the acromion and extending distally in line with the bicipital groove. This incision was carried down through the subcutaneous tissues to expose the deltoid fascia. The raphae between the anterior and middle thirds was identified and this plane developed to provide access into the subacromial space. Additional bursal tissues were debrided sharply using Metzenbaum scissors. The rotator cuff tear was readily identified. The margins were debrided sharply with a #15 blade and the exposed greater tuberosity roughened with a rongeur. The tear was repaired using two Biomet 2.9 mm JuggerKnot anchors. These sutures were then brought back laterally and secured using two [place] QuatroLink anchors to create a two-layer closure. Several additional #0 Ethibond interrupted sutures were placed in a side-to-side fashion to repair the longitudinal portion of the tear. An apparent watertight closure was obtained.  The bicipital groove was identified by palpation and opened for 1-1.5 cm. The biceps tendon stump was retrieved through this defect. The floor of the bicipital groove was roughened with a curet before another Biomet 2.9 mm JuggerKnot anchor was inserted. Both sets of sutures were passed through the biceps tendon and tied securely to effect the tenodesis. The bicipital sheath was reapproximated using two #0 Ethibond interrupted sutures, incorporating the biceps tendon to further reinforce the tenodesis.  The wound was copiously irrigated with sterile saline solution before the deltoid raphae was reapproximated using 2-0 Vicryl interrupted sutures. The subcutaneous tissues were closed in two layers using 2-0 Vicryl interrupted  sutures before the skin was closed using staples. The portal sites also were closed using staples. A sterile bulky dressing was applied to the shoulder before the arm was placed into a shoulder immobilizer. The patient was then awakened, extubated, and returned to the recovery room in satisfactory condition after tolerating the procedure well.

## 2017-12-18 NOTE — H&P (Signed)
Paper H&P to be scanned into permanent record. H&P reviewed and patient re-examined. No changes. 

## 2017-12-18 NOTE — Anesthesia Procedure Notes (Signed)
Procedure Name: Intubation Date/Time: 12/18/2017 2:18 PM Performed by: Nile Riggs, CRNA Pre-anesthesia Checklist: Patient identified, Emergency Drugs available, Suction available, Patient being monitored and Timeout performed Patient Re-evaluated:Patient Re-evaluated prior to induction Oxygen Delivery Method: Circle system utilized Preoxygenation: Pre-oxygenation with 100% oxygen Induction Type: IV induction Ventilation: Mask ventilation without difficulty Laryngoscope Size: Mac and 4 Grade View: Grade II Tube type: Oral Tube size: 7.5 mm Number of attempts: 1 Airway Equipment and Method: Stylet Placement Confirmation: ETT inserted through vocal cords under direct vision,  positive ETCO2,  CO2 detector and breath sounds checked- equal and bilateral Secured at: 22 cm Tube secured with: Tape

## 2017-12-18 NOTE — Anesthesia Post-op Follow-up Note (Signed)
Anesthesia QCDR form completed.        

## 2017-12-18 NOTE — Discharge Instructions (Addendum)
Keep dressing dry and intact.  May shower after dressing changed on post-op day #4 (Saturday).  Cover staples with Band-Aids after drying off. Apply ice frequently to shoulder. Take Celebrex daily with meals for 7-10 days, then as necessary. Take oxycodone as prescribed when needed.  May supplement with ES Tylenol if necessary. Keep shoulder immobilizer on at all times except may remove for bathing purposes. Follow-up in 10-14 days or as scheduled.    AMBULATORY SURGERY  DISCHARGE INSTRUCTIONS   1) The drugs that you were given will stay in your system until tomorrow so for the next 24 hours you should not:  A) Drive an automobile B) Make any legal decisions C) Drink any alcoholic beverage   2) You may resume regular meals tomorrow.  Today it is better to start with liquids and gradually work up to solid foods.  You may eat anything you prefer, but it is better to start with liquids, then soup and crackers, and gradually work up to solid foods.   3) Please notify your doctor immediately if you have any unusual bleeding, trouble breathing, redness and pain at the surgery site, drainage, fever, or pain not relieved by medication.    4) Additional Instructions:        Please contact your physician with any problems or Same Day Surgery at [phone], Monday through Friday 6 am to 4 pm, or [place] at [facility] number at [phone].  Interscalene Nerve Block (ISNB) Discharge Instructions   1.  For your surgery you have received an Interscalene Nerve Block. 2. Nerve Blocks affect many types of nerves, including nerves that control movement, pain and normal sensation.  You may experience feelings such as numbness, tingling, heaviness, weakness or the inability to move your arm or the feeling or sensation that your arm has "fallen asleep". 3. A nerve block can last for 2 - 36 hours or more depending on the medication used.  Usually the weakness wears off first.  The  tingling and heaviness usually wear off next.  Finally you may start to notice pain.  Keep in mind that this may occur in any order.  once a nerve block starts to wear off it is usually completely gone within 60 minutes. 4. ISNB may cause mild shortness of breath, a hoarse voice, blurry vision, unequal pupils, or drooping of the face on the same side as the nerve block.  These symptoms will usually go away within 12 hours.  Very rarely the procedure itself can cause mild seizures. 5. If needed, your surgeon will give you a prescription for pain medication.  It will take about 60 minutes for the oral pain medication to become fully effective.  So, it is recommended that you start taking this medication before the nerve block first begins to wear off, or when you first begin to feel discomfort. 6. Keep in mind that nerve blocks often wear off in the middle of the night.   If you are going to bed and the block has not started to wear off or you have not started to have any discomfort, consider setting an alarm for 2 to 3 hours, so you can assess your block.  If you notice the block is wearing off or you are starting to have discomfort, you can take your pain medication. 7. Take your pain medication only as prescribed.  Pain medication can cause sedation and decrease your breathing if you take more than you need for the level of pain that  you have. 8. Nausea is a common side effect of many pain medications.  You may want to eat something before taking your pain medicine to prevent nausea. 9. After an Interscalene nerve block, you cannot feel pain, pressure or extremes in temperature in the effected arm.  Because your arm is numb it is at an increased risk for injury.  To decrease the possibility of injury, please practice the following:  a. While you are awake change the position of your arm frequently to prevent too much pressure on any one area for prolonged periods of time. b.  If you have a cast or tight  dressing, check the color or your fingers every couple of hours.  Call your surgeon with the appearance of any discoloration (white or blue). c. If you are given a sling to wear before you go home, please wear it  at all times until the block has completely worn off.  Do not get up at night without your sling. d. If you experience any problems or concerns, please contact your surgeon's office. e. If you experience severe or prolonged shortness of breath go to the nearest emergency department.

## 2017-12-18 NOTE — Anesthesia Procedure Notes (Signed)
Anesthesia Regional Block: Interscalene brachial plexus block   Pre-Anesthetic Checklist: ,, timeout performed, Correct Patient, Correct Site, Correct Laterality, Correct Procedure, Correct Position, site marked, Risks and benefits discussed,  Surgical consent,  Pre-op evaluation,  At surgeon's request and post-op pain management  Laterality: Right  Prep: chloraprep       Needles:  Injection technique: Single-shot  Needle Type: Echogenic Stimulator Needle     Needle Length: 9cm  Needle Gauge: 21     Additional Needles:   Procedures:, nerve stimulator,,, ultrasound used (permanent image in chart),,,,   Nerve Stimulator or Paresthesia:  Response: biceps flexion, 0.8 mA,   Additional Responses:   Narrative:  Start time: 12/18/2017 1:09 PM End time: 12/18/2017 1:14 PM Injection made incrementally with aspirations every 5 mL.  Performed by: Personally   Additional Notes: Functioning IV was confirmed and monitors were applied.  A 15mm 22ga Stimuplex needle was used. Sterile prep and drape,hand hygiene and sterile gloves were used.  Negative aspiration and negative test dose prior to incremental administration of local anesthetic. The patient tolerated the procedure well.

## 2017-12-18 NOTE — Anesthesia Preprocedure Evaluation (Signed)
Anesthesia Evaluation  Patient identified by MRN, date of birth, ID band Patient awake    Reviewed: Allergy & Precautions, NPO status , Patient's Chart, lab work & pertinent test results  History of Anesthesia Complications (+) history of anesthetic complications  Airway Mallampati: III       Dental   Pulmonary sleep apnea , neg COPD, former smoker,           Cardiovascular hypertension, Pt. on medications (-) Past MI + dysrhythmias Atrial Fibrillation + pacemaker (-) Valvular Problems/Murmurs     Neuro/Psych neg Seizures    GI/Hepatic Neg liver ROS, GERD  Medicated and Controlled,  Endo/Other  neg diabetes  Renal/GU negative Renal ROS     Musculoskeletal   Abdominal   Peds  Hematology   Anesthesia Other Findings   Reproductive/Obstetrics                             Anesthesia Physical Anesthesia Plan  ASA: III  Anesthesia Plan: General   Post-op Pain Management: GA combined w/ Regional for post-op pain   Induction: Intravenous  PONV Risk Score and Plan:   Airway Management Planned: Oral ETT  Additional Equipment:   Intra-op Plan:   Post-operative Plan:   Informed Consent: I have reviewed the patients History and Physical, chart, labs and discussed the procedure including the risks, benefits and alternatives for the proposed anesthesia with the patient or authorized representative who has indicated his/her understanding and acceptance.     Plan Discussed with:   Anesthesia Plan Comments:         Anesthesia Quick Evaluation

## 2017-12-19 ENCOUNTER — Encounter: Payer: Self-pay | Admitting: Surgery

## 2018-06-24 ENCOUNTER — Other Ambulatory Visit (HOSPITAL_COMMUNITY): Payer: Self-pay | Admitting: Podiatry

## 2018-06-24 DIAGNOSIS — M7672 Peroneal tendinitis, left leg: Secondary | ICD-10-CM

## 2018-06-24 DIAGNOSIS — M25572 Pain in left ankle and joints of left foot: Secondary | ICD-10-CM

## 2018-07-03 ENCOUNTER — Ambulatory Visit (HOSPITAL_COMMUNITY)
Admission: RE | Admit: 2018-07-03 | Discharge: 2018-07-03 | Disposition: A | Discharge status: Home / Self Care | Payer: Medicare Other | Source: Ambulatory Visit | Attending: Podiatry | Admitting: Podiatry

## 2018-07-03 DIAGNOSIS — M899 Disorder of bone, unspecified: Secondary | ICD-10-CM | POA: Insufficient documentation

## 2018-07-03 DIAGNOSIS — M722 Plantar fascial fibromatosis: Secondary | ICD-10-CM | POA: Diagnosis not present

## 2018-07-03 DIAGNOSIS — M7672 Peroneal tendinitis, left leg: Secondary | ICD-10-CM | POA: Diagnosis not present

## 2018-07-03 DIAGNOSIS — R600 Localized edema: Secondary | ICD-10-CM | POA: Insufficient documentation

## 2018-07-03 DIAGNOSIS — M19072 Primary osteoarthritis, left ankle and foot: Secondary | ICD-10-CM | POA: Diagnosis not present

## 2018-07-03 DIAGNOSIS — M25572 Pain in left ankle and joints of left foot: Secondary | ICD-10-CM | POA: Diagnosis not present

## 2019-02-27 DIAGNOSIS — G5603 Carpal tunnel syndrome, bilateral upper limbs: Secondary | ICD-10-CM | POA: Insufficient documentation

## 2019-02-27 DIAGNOSIS — R200 Anesthesia of skin: Secondary | ICD-10-CM | POA: Insufficient documentation

## 2019-03-27 DIAGNOSIS — R200 Anesthesia of skin: Secondary | ICD-10-CM | POA: Insufficient documentation

## 2019-03-27 DIAGNOSIS — M542 Cervicalgia: Secondary | ICD-10-CM | POA: Insufficient documentation

## 2019-04-21 ENCOUNTER — Encounter
Admission: RE | Admit: 2019-04-21 | Discharge: 2019-04-21 | Disposition: A | Discharge status: Home / Self Care | Payer: Medicare Other | Source: Ambulatory Visit | Attending: Orthopedic Surgery | Admitting: Orthopedic Surgery

## 2019-04-21 ENCOUNTER — Other Ambulatory Visit: Payer: Self-pay

## 2019-04-21 DIAGNOSIS — Z7901 Long term (current) use of anticoagulants: Secondary | ICD-10-CM | POA: Diagnosis not present

## 2019-04-21 DIAGNOSIS — R9431 Abnormal electrocardiogram [ECG] [EKG]: Secondary | ICD-10-CM | POA: Insufficient documentation

## 2019-04-21 DIAGNOSIS — Z20828 Contact with and (suspected) exposure to other viral communicable diseases: Secondary | ICD-10-CM | POA: Diagnosis not present

## 2019-04-21 DIAGNOSIS — Z01818 Encounter for other preprocedural examination: Secondary | ICD-10-CM | POA: Insufficient documentation

## 2019-04-21 DIAGNOSIS — I10 Essential (primary) hypertension: Secondary | ICD-10-CM | POA: Insufficient documentation

## 2019-04-21 DIAGNOSIS — I480 Paroxysmal atrial fibrillation: Secondary | ICD-10-CM | POA: Diagnosis not present

## 2019-04-21 DIAGNOSIS — Z950 Presence of cardiac pacemaker: Secondary | ICD-10-CM | POA: Diagnosis not present

## 2019-04-21 DIAGNOSIS — K219 Gastro-esophageal reflux disease without esophagitis: Secondary | ICD-10-CM | POA: Diagnosis not present

## 2019-04-21 DIAGNOSIS — Z87891 Personal history of nicotine dependence: Secondary | ICD-10-CM | POA: Diagnosis not present

## 2019-04-21 DIAGNOSIS — G5603 Carpal tunnel syndrome, bilateral upper limbs: Secondary | ICD-10-CM | POA: Diagnosis present

## 2019-04-21 DIAGNOSIS — Z6841 Body Mass Index (BMI) 40.0 and over, adult: Secondary | ICD-10-CM | POA: Diagnosis not present

## 2019-04-21 DIAGNOSIS — G4730 Sleep apnea, unspecified: Secondary | ICD-10-CM | POA: Diagnosis not present

## 2019-04-21 DIAGNOSIS — E669 Obesity, unspecified: Secondary | ICD-10-CM | POA: Diagnosis not present

## 2019-04-21 DIAGNOSIS — Z01812 Encounter for preprocedural laboratory examination: Secondary | ICD-10-CM | POA: Insufficient documentation

## 2019-04-21 DIAGNOSIS — Z01810 Encounter for preprocedural cardiovascular examination: Secondary | ICD-10-CM | POA: Diagnosis not present

## 2019-04-21 LAB — BASIC METABOLIC PANEL
Anion gap: 9 (ref 5–15)
BUN: 18 mg/dL (ref 8–23)
CO2: 27 mmol/L (ref 22–32)
Calcium: 9.6 mg/dL (ref 8.9–10.3)
Chloride: 103 mmol/L (ref 98–111)
Creatinine, Ser: 1 mg/dL (ref 0.61–1.24)
GFR calc Af Amer: >60 mL/min (ref >60)
GFR calc non Af Amer: >60 mL/min (ref >60)
Glucose, Bld: 96 mg/dL (ref 70–99)
Potassium: 3.8 mmol/L (ref 3.5–5.1)
Sodium: 139 mmol/L (ref 135–145)

## 2019-04-21 LAB — CBC
HCT: 41.2 % (ref 39.0–52.0)
Hemoglobin: 13.7 g/dL (ref 13.0–17.0)
MCH: 30.2 pg (ref 26.0–34.0)
MCHC: 33.3 g/dL (ref 30.0–36.0)
MCV: 90.7 fL (ref 80.0–100.0)
Platelets: 165 10*3/uL (ref 150–400)
RBC: 4.54 MIL/uL (ref 4.22–5.81)
RDW: 12.9 % (ref 11.5–15.5)
WBC: 6.2 10*3/uL (ref 4.0–10.5)
nRBC: 0 % (ref 0.0–0.2)

## 2019-04-21 LAB — SARS CORONAVIRUS 2 (TAT 6-24 HRS): SARS Coronavirus 2: NEGATIVE

## 2019-04-21 NOTE — Patient Instructions (Signed)
Your procedure is scheduled on: Thursday, April 24, 2019 Report to Day Surgery on the 2nd floor of the [facility]. To find out your arrival time, please call [phone] between 1PM - 3PM on: Wednesday, April 23, 2019  REMEMBER: Instructions that are not followed completely may result in serious medical risk, up to and including death; or upon the discretion of your surgeon and anesthesiologist your surgery may need to be rescheduled.  Do not eat food after midnight the night before surgery.  No gum chewing, lozengers or hard candies.  You may however, drink CLEAR liquids up to 2 hours before you are scheduled to arrive for your surgery. Do not drink anything within 2 hours of the start of your surgery.  Clear liquids include: - water  - apple juice without pulp - gatorade - black coffee or tea (Do NOT add milk or creamers to the coffee or tea) Do NOT drink anything that is not on this list.  No Alcohol for 24 hours before or after surgery.  No Smoking including e-cigarettes for 24 hours prior to surgery.  No chewable tobacco products for at least 6 hours prior to surgery.  No nicotine patches on the day of surgery.  On the morning of surgery brush your teeth with toothpaste and water, you may rinse your mouth with mouthwash if you wish. Do not swallow any toothpaste or mouthwash.  Notify your doctor if there is any change in your medical condition (cold, fever, infection).  Do not wear jewelry, make-up, hairpins, clips or nail polish.  Do not wear lotions, powders, or perfumes.   Do not shave 48 hours prior to surgery.   Contacts and dentures may not be worn into surgery.  Do not bring valuables to the hospital, including drivers license, insurance or credit cards.  [facility] is not responsible for any belongings or valuables.   TAKE THESE MEDICATIONS THE MORNING OF SURGERY:  1.  Omeprazole - (take one the night before and one on the morning of surgery - helps to  prevent nausea after surgery.)  Use CHG Soap as directed on instruction sheet.  Bring your C-PAP to the hospital with you in case you may have to spend the night.   Follow recommendations from Cardiologist, Pulmonologist or PCP regarding stopping Eliquis.  NOW!  Stop Anti-inflammatories (NSAIDS) such as Advil, Aleve, Ibuprofen, Motrin, Naproxen, Naprosyn and Aspirin based products such as Excedrin, Goodys Powder, BC Powder. (May take Tylenol or Acetaminophen if needed.)  NOW!  Stop ANY OVER THE COUNTER supplements until after surgery. FISH OIL, VITAMIN C, (May continue Vitamin D, Vitamin B, and multivitamin.)  Wear comfortable clothing (specific to your surgery type) to the hospital.  If you are being discharged the day of surgery, you will not be allowed to drive home. You will need a responsible adult to drive you home and stay with you that night.   If you are taking public transportation, you will need to have a responsible adult with you. Please confirm with your physician that it is acceptable to use public transportation.   Please call [phone] if you have any questions about these instructions.

## 2019-04-22 NOTE — Pre-Procedure Instructions (Signed)
MESSAGED DR PARASCHOS ABOUT EKG. PACEMAKER FORM FAXED 04/21/19

## 2019-04-24 ENCOUNTER — Ambulatory Visit: Payer: Medicare Other | Admitting: Certified Registered Nurse Anesthetist

## 2019-04-24 ENCOUNTER — Encounter
Admission: RE | Disposition: A | Discharge status: Home / Self Care | Payer: Self-pay | Source: Home / Self Care | Attending: Orthopedic Surgery

## 2019-04-24 ENCOUNTER — Ambulatory Visit
Admission: RE | Admit: 2019-04-24 | Discharge: 2019-04-24 | Disposition: A | Discharge status: Home / Self Care | Payer: Medicare Other | Attending: Orthopedic Surgery | Admitting: Orthopedic Surgery

## 2019-04-24 ENCOUNTER — Encounter: Payer: Self-pay | Admitting: *Deleted

## 2019-04-24 ENCOUNTER — Other Ambulatory Visit: Payer: Self-pay

## 2019-04-24 DIAGNOSIS — Z7901 Long term (current) use of anticoagulants: Secondary | ICD-10-CM | POA: Insufficient documentation

## 2019-04-24 DIAGNOSIS — Z6841 Body Mass Index (BMI) 40.0 and over, adult: Secondary | ICD-10-CM | POA: Insufficient documentation

## 2019-04-24 DIAGNOSIS — I480 Paroxysmal atrial fibrillation: Secondary | ICD-10-CM | POA: Insufficient documentation

## 2019-04-24 DIAGNOSIS — Z20828 Contact with and (suspected) exposure to other viral communicable diseases: Secondary | ICD-10-CM | POA: Insufficient documentation

## 2019-04-24 DIAGNOSIS — Z87891 Personal history of nicotine dependence: Secondary | ICD-10-CM | POA: Insufficient documentation

## 2019-04-24 DIAGNOSIS — Z950 Presence of cardiac pacemaker: Secondary | ICD-10-CM | POA: Insufficient documentation

## 2019-04-24 DIAGNOSIS — G5603 Carpal tunnel syndrome, bilateral upper limbs: Secondary | ICD-10-CM | POA: Diagnosis not present

## 2019-04-24 DIAGNOSIS — E669 Obesity, unspecified: Secondary | ICD-10-CM | POA: Insufficient documentation

## 2019-04-24 DIAGNOSIS — I10 Essential (primary) hypertension: Secondary | ICD-10-CM | POA: Insufficient documentation

## 2019-04-24 DIAGNOSIS — G4730 Sleep apnea, unspecified: Secondary | ICD-10-CM | POA: Insufficient documentation

## 2019-04-24 DIAGNOSIS — K219 Gastro-esophageal reflux disease without esophagitis: Secondary | ICD-10-CM | POA: Insufficient documentation

## 2019-04-24 HISTORY — PX: CARPAL TUNNEL RELEASE: SHX101

## 2019-04-24 SURGERY — CARPAL TUNNEL RELEASE
Anesthesia: General | Site: Wrist | Laterality: Left

## 2019-04-24 MED ORDER — METOCLOPRAMIDE HCL 5 MG/ML IJ SOLN: 5.0000 mg | Freq: Three times a day (TID) | INTRAMUSCULAR | Status: DC | PRN

## 2019-04-24 MED ORDER — LACTATED RINGERS IV SOLN
INTRAVENOUS | Status: DC
  Administered 2019-04-24: 10:00:00 via INTRAVENOUS

## 2019-04-24 MED ORDER — ONDANSETRON HCL 4 MG/2ML IJ SOLN
INTRAMUSCULAR | Status: AC
  Filled 2019-04-24: qty 2

## 2019-04-24 MED ORDER — HYDROCODONE-ACETAMINOPHEN 5-325 MG PO TABS: 1.0000 | ORAL_TABLET | ORAL | Status: DC | PRN

## 2019-04-24 MED ORDER — MORPHINE SULFATE (PF) 4 MG/ML IV SOLN: 0.5000 mg | INTRAVENOUS | Status: DC | PRN

## 2019-04-24 MED ORDER — FENTANYL CITRATE (PF) 100 MCG/2ML IJ SOLN
INTRAMUSCULAR | Status: DC | PRN
  Administered 2019-04-24 (×4): 25 ug via INTRAVENOUS

## 2019-04-24 MED ORDER — PROPOFOL 10 MG/ML IV BOLUS
INTRAVENOUS | Status: AC
  Filled 2019-04-24: qty 20

## 2019-04-24 MED ORDER — FENTANYL CITRATE (PF) 100 MCG/2ML IJ SOLN: 25.0000 ug | INTRAMUSCULAR | Status: DC | PRN

## 2019-04-24 MED ORDER — KETOROLAC TROMETHAMINE 30 MG/ML IJ SOLN
INTRAMUSCULAR | Status: AC
  Filled 2019-04-24: qty 1

## 2019-04-24 MED ORDER — LIDOCAINE HCL (PF) 2 % IJ SOLN
INTRAMUSCULAR | Status: AC
  Filled 2019-04-24: qty 10

## 2019-04-24 MED ORDER — LIDOCAINE HCL (CARDIAC) PF 100 MG/5ML IV SOSY
PREFILLED_SYRINGE | INTRAVENOUS | Status: DC | PRN
  Administered 2019-04-24: 100 mg via INTRAVENOUS

## 2019-04-24 MED ORDER — MIDAZOLAM HCL 2 MG/2ML IJ SOLN
INTRAMUSCULAR | Status: AC
  Filled 2019-04-24: qty 2

## 2019-04-24 MED ORDER — DEXMEDETOMIDINE HCL 200 MCG/2ML IV SOLN
INTRAVENOUS | Status: DC | PRN
  Administered 2019-04-24: 8 ug via INTRAVENOUS
  Administered 2019-04-24: 4 ug via INTRAVENOUS

## 2019-04-24 MED ORDER — HYDROCODONE-ACETAMINOPHEN 5-325 MG PO TABS: 1.0000 | ORAL_TABLET | Freq: Four times a day (QID) | ORAL | 0 refills | Status: DC | PRN

## 2019-04-24 MED ORDER — ACETAMINOPHEN 500 MG PO TABS: 500.0000 mg | ORAL_TABLET | Freq: Four times a day (QID) | ORAL | Status: DC

## 2019-04-24 MED ORDER — ONDANSETRON HCL 4 MG PO TABS: 4.0000 mg | ORAL_TABLET | Freq: Four times a day (QID) | ORAL | Status: DC | PRN

## 2019-04-24 MED ORDER — DEXAMETHASONE SODIUM PHOSPHATE 10 MG/ML IJ SOLN
INTRAMUSCULAR | Status: DC | PRN
  Administered 2019-04-24: 10 mg via INTRAVENOUS

## 2019-04-24 MED ORDER — BUPIVACAINE HCL (PF) 0.5 % IJ SOLN
INTRAMUSCULAR | Status: AC
  Filled 2019-04-24: qty 30

## 2019-04-24 MED ORDER — FENTANYL CITRATE (PF) 100 MCG/2ML IJ SOLN
INTRAMUSCULAR | Status: AC
  Filled 2019-04-24: qty 2

## 2019-04-24 MED ORDER — MIDAZOLAM HCL 2 MG/2ML IJ SOLN
INTRAMUSCULAR | Status: DC | PRN
  Administered 2019-04-24: 2 mg via INTRAVENOUS

## 2019-04-24 MED ORDER — KETOROLAC TROMETHAMINE 30 MG/ML IJ SOLN
INTRAMUSCULAR | Status: DC | PRN
  Administered 2019-04-24: 30 mg via INTRAVENOUS

## 2019-04-24 MED ORDER — DEXAMETHASONE SODIUM PHOSPHATE 10 MG/ML IJ SOLN
INTRAMUSCULAR | Status: AC
  Filled 2019-04-24: qty 1

## 2019-04-24 MED ORDER — HYDROCODONE-ACETAMINOPHEN 7.5-325 MG PO TABS
1.0000 | ORAL_TABLET | ORAL | Status: DC | PRN
  Filled 2019-04-24: qty 2

## 2019-04-24 MED ORDER — METOCLOPRAMIDE HCL 10 MG PO TABS: 5.0000 mg | ORAL_TABLET | Freq: Three times a day (TID) | ORAL | Status: DC | PRN

## 2019-04-24 MED ORDER — SODIUM CHLORIDE 0.9 % IV SOLN: INTRAVENOUS | Status: DC

## 2019-04-24 MED ORDER — DEXMEDETOMIDINE HCL IN NACL 80 MCG/20ML IV SOLN
INTRAVENOUS | Status: AC
  Filled 2019-04-24: qty 20

## 2019-04-24 MED ORDER — ONDANSETRON HCL 4 MG/2ML IJ SOLN: 4.0000 mg | Freq: Once | INTRAMUSCULAR | Status: DC | PRN

## 2019-04-24 MED ORDER — ACETAMINOPHEN 325 MG PO TABS: 325.0000 mg | ORAL_TABLET | Freq: Four times a day (QID) | ORAL | Status: DC | PRN

## 2019-04-24 MED ORDER — ONDANSETRON HCL 4 MG/2ML IJ SOLN
INTRAMUSCULAR | Status: DC | PRN
  Administered 2019-04-24: 4 mg via INTRAVENOUS

## 2019-04-24 MED ORDER — ONDANSETRON HCL 4 MG/2ML IJ SOLN: 4.0000 mg | Freq: Four times a day (QID) | INTRAMUSCULAR | Status: DC | PRN

## 2019-04-24 MED ORDER — PROPOFOL 10 MG/ML IV BOLUS
INTRAVENOUS | Status: DC | PRN
  Administered 2019-04-24: 200 mg via INTRAVENOUS

## 2019-04-24 MED ORDER — BUPIVACAINE HCL 0.5 % IJ SOLN
INTRAMUSCULAR | Status: DC | PRN
  Administered 2019-04-24: 10 mL

## 2019-04-24 SURGICAL SUPPLY — 24 items
BNDG ELASTIC 3X5.8 VLCR STR LF (GAUZE/BANDAGES/DRESSINGS) ×3 IMPLANT
CANISTER SUCT 1200ML W/VALVE (MISCELLANEOUS) ×3 IMPLANT
CHLORAPREP W/TINT 26 (MISCELLANEOUS) ×3 IMPLANT
COVER WAND RF STERILE (DRAPES) ×3 IMPLANT
CUFF TOURN SGL QUICK 18X4 (TOURNIQUET CUFF) IMPLANT
ELECT CAUTERY NDL 2.0 MIC (NEEDLE) IMPLANT
ELECT CAUTERY NEEDLE 2.0 MIC (NEEDLE) IMPLANT
GAUZE SPONGE 4X4 12PLY STRL (GAUZE/BANDAGES/DRESSINGS) ×3 IMPLANT
GAUZE XEROFORM 1X8 LF (GAUZE/BANDAGES/DRESSINGS) ×3 IMPLANT
GLOVE SURG SYN 9.0  PF PI (GLOVE) ×2
GLOVE SURG SYN 9.0 PF PI (GLOVE) ×1 IMPLANT
GOWN SRG 2XL LVL 4 RGLN SLV (GOWNS) ×1 IMPLANT
GOWN STRL NON-REIN 2XL LVL4 (GOWNS) ×2
GOWN STRL REUS W/ TWL LRG LVL3 (GOWN DISPOSABLE) ×1 IMPLANT
GOWN STRL REUS W/TWL LRG LVL3 (GOWN DISPOSABLE) ×2
KIT TURNOVER KIT A (KITS) ×3 IMPLANT
NS IRRIG 500ML POUR BTL (IV SOLUTION) ×3 IMPLANT
PACK EXTREMITY ARMC (MISCELLANEOUS) ×3 IMPLANT
PAD CAST CTTN 4X4 STRL (SOFTGOODS) ×1 IMPLANT
PADDING CAST COTTON 4X4 STRL (SOFTGOODS) ×2
SCALPEL PROTECTED #15 DISP (BLADE) ×6 IMPLANT
SUT ETHILON 4-0 (SUTURE) ×2
SUT ETHILON 4-0 FS2 18XMFL BLK (SUTURE) ×1
SUTURE ETHLN 4-0 FS2 18XMF BLK (SUTURE) ×1 IMPLANT

## 2019-04-24 NOTE — Op Note (Signed)
04/24/2019  11:18 AM  PATIENT:  [name]  68 y.o. male  PRE-OPERATIVE DIAGNOSIS:  CARPAL TUNNEL SYNDROME, LEFT  POST-OPERATIVE DIAGNOSIS:  CARPAL TUNNEL SYNDROME, LEFT  PROCEDURE:  Procedure(s): CARPAL TUNNEL RELEASE (Left)  SURGEON: Laurene Footman, MD  ASSISTANTS: None  ANESTHESIA:   general  EBL:  Total I/O In: 400 [I.V.:400] Out: 2 [Blood:2]  BLOOD ADMINISTERED:none  DRAINS: none   LOCAL MEDICATIONS USED:  MARCAINE     SPECIMEN:  No Specimen  DISPOSITION OF SPECIMEN:  N/A  COUNTS:  YES  TOURNIQUET: 12 minutes at 250 mmHg  IMPLANTS: None  DICTATION: .Dragon Dictation patient was brought to the operating room and after adequate anesthesia was obtained the left arm was prepped and draped in the usual sterile fashion.  After patient identification and timeout procedures were completed tourniquet was raised toward 50 mmHg.  Incision in line with the ring metacarpal was carried out approximately 2 and half centimeters in length.  Subcutaneous tissue was spread and the transcarpal ligament identified.  This was opened and a vascular hemostat placed deep to protect the underlying structures.  Proximal distal releases carried out distally till there is fat noted around the median nerve and proximally about a centimeter proximal to the wrist flexion crease at this point there is good vascular blush to the nerve and all apparent constriction of been resolved the compression appeared to be in the proximal centimeter of the transverse carpal ligament.  The wound was irrigated and then infiltrated with 10 cc half percent Sensorcaine plain and the wound closed with simple erupted 4-0 nylon followed by Xeroform 4 x 4 web roll and Ace wrap  PLAN OF CARE: Discharge to home after PACU  PATIENT DISPOSITION:  PACU - hemodynamically stable.

## 2019-04-24 NOTE — Anesthesia Postprocedure Evaluation (Signed)
Anesthesia Post Note  Patient: [name]  Procedure(s) Performed: CARPAL TUNNEL RELEASE (Left Wrist)  Patient location during evaluation: PACU Anesthesia Type: General Level of consciousness: awake and alert Pain management: pain level controlled Vital Signs Assessment: post-procedure vital signs reviewed and stable Respiratory status: spontaneous breathing and respiratory function stable Cardiovascular status: stable Anesthetic complications: no     Last Vitals:  Vitals:   04/24/19 1200 04/24/19 1208  BP: (!) 155/92 (!) 165/97  Pulse: 62 63  Resp: 10 16  Temp: (!) 36.1 C (!) 36.3 C  SpO2: 99% 98%    Last Pain:  Vitals:   04/24/19 1208  TempSrc: Temporal  PainSc: 2                  KEPHART,WILLIAM K

## 2019-04-24 NOTE — Anesthesia Preprocedure Evaluation (Signed)
Anesthesia Evaluation  Patient identified by MRN, date of birth, ID band Patient awake    Reviewed: Allergy & Precautions, NPO status , Patient's Chart, lab work & pertinent test results  History of Anesthesia Complications (+) history of anesthetic complications (failed spinal)  Airway Mallampati: II       Dental   Pulmonary sleep apnea and Continuous Positive Airway Pressure Ventilation , neg COPD, former smoker,           Cardiovascular hypertension, Pt. on medications (-) Past MI + dysrhythmias Atrial Fibrillation + pacemaker (-) Valvular Problems/Murmurs     Neuro/Psych neg Seizures    GI/Hepatic Neg liver ROS, GERD  Medicated and Controlled,  Endo/Other  neg diabetes  Renal/GU negative Renal ROS     Musculoskeletal   Abdominal   Peds  Hematology   Anesthesia Other Findings   Reproductive/Obstetrics                             Anesthesia Physical Anesthesia Plan  ASA: III  Anesthesia Plan: General   Post-op Pain Management:    Induction: Intravenous  PONV Risk Score and Plan:   Airway Management Planned: LMA  Additional Equipment:   Intra-op Plan:   Post-operative Plan:   Informed Consent: I have reviewed the patients History and Physical, chart, labs and discussed the procedure including the risks, benefits and alternatives for the proposed anesthesia with the patient or authorized representative who has indicated his/her understanding and acceptance.       Plan Discussed with:   Anesthesia Plan Comments:         Anesthesia Quick Evaluation

## 2019-04-24 NOTE — Anesthesia Procedure Notes (Signed)
Procedure Name: LMA Insertion Date/Time: 04/24/2019 10:48 AM Performed by: Eben Burow, CRNA Pre-anesthesia Checklist: Patient identified, Emergency Drugs available, Suction available and Patient being monitored Patient Re-evaluated:Patient Re-evaluated prior to induction Oxygen Delivery Method: Circle system utilized Preoxygenation: Pre-oxygenation with 100% oxygen Induction Type: IV induction Ventilation: Mask ventilation without difficulty LMA: LMA inserted LMA Size: 5.0 Number of attempts: 1 Placement Confirmation: positive ETCO2 and breath sounds checked- equal and bilateral Tube secured with: Tape Dental Injury: Teeth and Oropharynx as per pre-operative assessment

## 2019-04-24 NOTE — Discharge Instructions (Addendum)
Keep arm elevated is much as you can.  Loosen Ace wrap prior to discharge and if your fingers swell over the weekend.  Leave underlying wrap alone.  Work on finger range of motion is much as you can.  Pain medicine as directed   AMBULATORY SURGERY  DISCHARGE INSTRUCTIONS   1) The drugs that you were given will stay in your system until tomorrow so for the next 24 hours you should not:  A) Drive an automobile B) Make any legal decisions C) Drink any alcoholic beverage   2) You may resume regular meals tomorrow.  Today it is better to start with liquids and gradually work up to solid foods.  You may eat anything you prefer, but it is better to start with liquids, then soup and crackers, and gradually work up to solid foods.   3) Please notify your doctor immediately if you have any unusual bleeding, trouble breathing, redness and pain at the surgery site, drainage, fever, or pain not relieved by medication.    4) Additional Instructions:        Please contact your physician with any problems or Same Day Surgery at [phone], Monday through Friday 6 am to 4 pm, or [place] at [facility] number at [phone].

## 2019-04-24 NOTE — H&P (Signed)
Reviewed paper H+P, will be scanned into chart. No changes noted.  

## 2019-04-24 NOTE — Anesthesia Post-op Follow-up Note (Signed)
Anesthesia QCDR form completed.        

## 2019-04-24 NOTE — Transfer of Care (Signed)
Immediate Anesthesia Transfer of Care Note  Patient: Kemonte Ullman Stettler  Procedure(s) Performed: CARPAL TUNNEL RELEASE (Left Wrist)  Patient Location: PACU  Anesthesia Type:General  Level of Consciousness: drowsy  Airway & Oxygen Therapy: Patient Spontanous Breathing and Patient connected to face mask oxygen  Post-op Assessment: Report given to RN and Post -op Vital signs reviewed and stable  Post vital signs: Reviewed and stable  Last Vitals:  Vitals Value Taken Time  BP 114/75 04/24/19 1115  Temp    Pulse 62 04/24/19 1116  Resp 10 04/24/19 1116  SpO2 98 % 04/24/19 1116  Vitals shown include unvalidated device data.  Last Pain:  Vitals:   04/24/19 0927  TempSrc: Tympanic  PainSc: 0-No pain         Complications: No apparent anesthesia complications

## 2019-05-14 DIAGNOSIS — G5602 Carpal tunnel syndrome, left upper limb: Secondary | ICD-10-CM | POA: Insufficient documentation

## 2019-08-13 ENCOUNTER — Other Ambulatory Visit: Payer: Self-pay

## 2019-08-13 DIAGNOSIS — Z20822 Contact with and (suspected) exposure to covid-19: Secondary | ICD-10-CM

## 2019-08-14 LAB — NOVEL CORONAVIRUS, NAA: SARS-CoV-2, NAA: DETECTED — AB

## 2019-08-15 ENCOUNTER — Telehealth: Payer: Self-pay | Admitting: Unknown Physician Specialty

## 2019-08-15 NOTE — Telephone Encounter (Signed)
Pt with symptoms for longer than 5 days.  Mild symptoms and not a candidate for monoclonal antibody infusion.  Discussed seeking care for shortness of breath or difficulty breathing

## 2019-11-17 DIAGNOSIS — M545 Low back pain, unspecified: Secondary | ICD-10-CM | POA: Insufficient documentation

## 2021-03-14 ENCOUNTER — Encounter: Payer: Self-pay | Admitting: *Deleted

## 2021-03-15 ENCOUNTER — Ambulatory Visit: Payer: Medicare Other | Admitting: Anesthesiology

## 2021-03-15 ENCOUNTER — Other Ambulatory Visit: Payer: Self-pay

## 2021-03-15 ENCOUNTER — Encounter
Admission: RE | Disposition: A | Discharge status: Home / Self Care | Payer: Self-pay | Source: Home / Self Care | Attending: Gastroenterology

## 2021-03-15 ENCOUNTER — Ambulatory Visit
Admission: RE | Admit: 2021-03-15 | Discharge: 2021-03-15 | Disposition: A | Discharge status: Home / Self Care | Payer: Medicare Other | Attending: Gastroenterology | Admitting: Gastroenterology

## 2021-03-15 ENCOUNTER — Encounter: Payer: Self-pay | Admitting: *Deleted

## 2021-03-15 DIAGNOSIS — Z791 Long term (current) use of non-steroidal anti-inflammatories (NSAID): Secondary | ICD-10-CM | POA: Insufficient documentation

## 2021-03-15 DIAGNOSIS — D122 Benign neoplasm of ascending colon: Secondary | ICD-10-CM | POA: Diagnosis not present

## 2021-03-15 DIAGNOSIS — K640 First degree hemorrhoids: Secondary | ICD-10-CM | POA: Diagnosis not present

## 2021-03-15 DIAGNOSIS — Z79899 Other long term (current) drug therapy: Secondary | ICD-10-CM | POA: Insufficient documentation

## 2021-03-15 DIAGNOSIS — Z86010 Personal history of colonic polyps: Secondary | ICD-10-CM | POA: Insufficient documentation

## 2021-03-15 DIAGNOSIS — Z950 Presence of cardiac pacemaker: Secondary | ICD-10-CM | POA: Diagnosis not present

## 2021-03-15 DIAGNOSIS — I10 Essential (primary) hypertension: Secondary | ICD-10-CM | POA: Diagnosis not present

## 2021-03-15 DIAGNOSIS — Z9049 Acquired absence of other specified parts of digestive tract: Secondary | ICD-10-CM | POA: Diagnosis not present

## 2021-03-15 DIAGNOSIS — K5730 Diverticulosis of large intestine without perforation or abscess without bleeding: Secondary | ICD-10-CM | POA: Diagnosis not present

## 2021-03-15 DIAGNOSIS — D125 Benign neoplasm of sigmoid colon: Secondary | ICD-10-CM | POA: Diagnosis not present

## 2021-03-15 DIAGNOSIS — D175 Benign lipomatous neoplasm of intra-abdominal organs: Secondary | ICD-10-CM | POA: Diagnosis not present

## 2021-03-15 DIAGNOSIS — D123 Benign neoplasm of transverse colon: Secondary | ICD-10-CM | POA: Insufficient documentation

## 2021-03-15 DIAGNOSIS — Z888 Allergy status to other drugs, medicaments and biological substances status: Secondary | ICD-10-CM | POA: Diagnosis not present

## 2021-03-15 DIAGNOSIS — Z1211 Encounter for screening for malignant neoplasm of colon: Secondary | ICD-10-CM | POA: Diagnosis not present

## 2021-03-15 DIAGNOSIS — I4891 Unspecified atrial fibrillation: Secondary | ICD-10-CM | POA: Diagnosis not present

## 2021-03-15 HISTORY — DX: Restless legs syndrome: G25.81

## 2021-03-15 HISTORY — PX: COLONOSCOPY WITH PROPOFOL: SHX5780

## 2021-03-15 HISTORY — DX: Cardiomyopathy, unspecified: I42.9

## 2021-03-15 HISTORY — DX: Cardiac arrhythmia, unspecified: I49.9

## 2021-03-15 SURGERY — COLONOSCOPY WITH PROPOFOL
Anesthesia: General

## 2021-03-15 MED ORDER — PROPOFOL 500 MG/50ML IV EMUL
INTRAVENOUS | Status: AC
  Filled 2021-03-15: qty 50

## 2021-03-15 MED ORDER — PROPOFOL 500 MG/50ML IV EMUL
INTRAVENOUS | Status: DC | PRN
  Administered 2021-03-15: 125 ug/kg/min via INTRAVENOUS

## 2021-03-15 MED ORDER — SODIUM CHLORIDE 0.9 % IV SOLN: INTRAVENOUS | Status: DC

## 2021-03-15 MED ORDER — PROPOFOL 10 MG/ML IV BOLUS
INTRAVENOUS | Status: DC | PRN
  Administered 2021-03-15: 40 mg via INTRAVENOUS

## 2021-03-15 NOTE — Op Note (Signed)
[facility] Gastroenterology Patient Name: [name] Procedure Date: 03/15/2021 10:09 AM MRN: [id number] Account #: [id number] Date of Birth: 06/07/1951 Admit Type: Outpatient Age: 70 Room: [facility] ENDO ROOM 1 Gender: Male Note Status: Finalized Procedure:             Colonoscopy Indications:           Surveillance: Personal history of adenomatous polyps                         on last colonoscopy > 5 years ago Providers:             Andrey Farmer MD, MD Referring MD:          Sofie Hartigan (Referring MD) Medicines:             Monitored Anesthesia Care Complications:         No immediate complications. Estimated blood loss:                         Minimal. Procedure:             Pre-Anesthesia Assessment:                        - Prior to the procedure, a History and Physical was                         performed, and patient medications and allergies were                         reviewed. The patient is competent. The risks and                         benefits of the procedure and the sedation options and                         risks were discussed with the patient. All questions                         were answered and informed consent was obtained.                         Patient identification and proposed procedure were                         verified by the physician, the nurse, the anesthetist                         and the technician in the endoscopy suite. Mental                         Status Examination: alert and oriented. Airway                         Examination: normal oropharyngeal airway and neck                         mobility. Respiratory Examination: clear to  auscultation. CV Examination: normal. Prophylactic                         Antibiotics: The patient does not require prophylactic                         antibiotics. Prior Anticoagulants: The patient has                         taken Eliquis  (apixaban), last dose was 3 days prior                         to procedure. ASA Grade Assessment: II - A patient                         with mild systemic disease. After reviewing the risks                         and benefits, the patient was deemed in satisfactory                         condition to undergo the procedure. The anesthesia                         plan was to use monitored anesthesia care (MAC).                         Immediately prior to administration of medications,                         the patient was re-assessed for adequacy to receive                         sedatives. The heart rate, respiratory rate, oxygen                         saturations, blood pressure, adequacy of pulmonary                         ventilation, and response to care were monitored                         throughout the procedure. The physical status of the                         patient was re-assessed after the procedure.                        After obtaining informed consent, the colonoscope was                         passed under direct vision. Throughout the procedure,                         the patient's blood pressure, pulse, and oxygen                         saturations were monitored continuously. The  Colonoscope was introduced through the anus and                         advanced to the the cecum, identified by appendiceal                         orifice and ileocecal valve. The colonoscopy was                         performed without difficulty. The patient tolerated                         the procedure well. The quality of the bowel                         preparation was good. Findings:      The perianal and digital rectal examinations were normal.      A 1 mm polyp was found in the ascending colon. The polyp was sessile.       The polyp was removed with a jumbo cold forceps. Resection and retrieval       were complete. Estimated blood loss was  minimal.      A 2 mm polyp was found in the hepatic flexure. The polyp was sessile.       The polyp was removed with a cold snare. Resection and retrieval were       complete. Estimated blood loss was minimal.      Two sessile polyps were found in the sigmoid colon. The polyps were 2 mm       in size. These polyps were removed with a cold snare. Resection and       retrieval were complete. Estimated blood loss was minimal.      Scattered small-mouthed diverticula were found in the sigmoid colon and       descending colon.      There was a small lipoma, in the recto-sigmoid colon.      Non-bleeding internal hemorrhoids were found during retroflexion. The       hemorrhoids were Grade I (internal hemorrhoids that do not prolapse).      The exam was otherwise without abnormality on direct and retroflexion       views. Impression:            - One 1 mm polyp in the ascending colon, removed with                         a jumbo cold forceps. Resected and retrieved.                        - One 2 mm polyp at the hepatic flexure, removed with                         a cold snare. Resected and retrieved.                        - Two 2 mm polyps in the sigmoid colon, removed with a                         cold snare. Resected and retrieved.                        -  Diverticulosis in the sigmoid colon and in the                         descending colon.                        - Small lipoma in the recto-sigmoid colon.                        - Non-bleeding internal hemorrhoids.                        - The examination was otherwise normal on direct and                         retroflexion views. Recommendation:        - Discharge patient to home.                        - Resume previous diet.                        - Continue present medications.                        - Resume Eliquis (apixaban) at prior dose today.                        - Await pathology results.                        - Repeat  colonoscopy date to be determined after                         pending pathology results are reviewed for                         surveillance.                        - Return to referring physician as previously                         scheduled. Procedure Code(s):     --- Professional ---                        [phone], Colonoscopy, flexible; with removal of                         tumor(s), polyp(s), or other lesion(s) by snare                         technique                        [id number], 59, Colonoscopy, flexible; with biopsy, single                         or multiple Diagnosis Code(s):     --- Professional ---                        K63.5, Polyp  of colon                        Z86.010, Personal history of colonic polyps                        K64.0, First degree hemorrhoids                        D17.5, Benign lipomatous neoplasm of intra-abdominal                         organs                        K57.30, Diverticulosis of large intestine without                         perforation or abscess without bleeding CPT copyright 2019 American Medical Association. All rights reserved. The codes documented in this report are preliminary and upon coder review may  be revised to meet current compliance requirements. Andrey Farmer MD, MD 03/15/2021 10:52:57 AM Number of Addenda: 0 Note Initiated On: 03/15/2021 10:09 AM Scope Withdrawal Time: 0 hours 16 minutes 30 seconds  Total Procedure Duration: 0 hours 24 minutes 12 seconds  Estimated Blood Loss:  Estimated blood loss was minimal.      [facility]

## 2021-03-15 NOTE — Anesthesia Preprocedure Evaluation (Signed)
Anesthesia Evaluation  Patient identified by MRN, date of birth, ID band Patient awake    Reviewed: Allergy & Precautions, NPO status , Patient's Chart, lab work & pertinent test results  History of Anesthesia Complications (+) history of anesthetic complications (failed spinal)  Airway Mallampati: II       Dental  (+) Dental Advidsory Given   Pulmonary neg shortness of breath, sleep apnea and Continuous Positive Airway Pressure Ventilation , neg COPD, neg recent URI, former smoker,           Cardiovascular hypertension, Pt. on medications (-) angina(-) Past MI + dysrhythmias Atrial Fibrillation + pacemaker (-) Valvular Problems/Murmurs     Neuro/Psych neg Seizures    GI/Hepatic Neg liver ROS, GERD  Medicated and Controlled,  Endo/Other  neg diabetes  Renal/GU negative Renal ROS     Musculoskeletal   Abdominal   Peds  Hematology   Anesthesia Other Findings Past Medical History: No date: Adenomatous polyp of colon No date: Anginal pain (HCC) No date: BPH (benign prostatic hyperplasia) No date: Cardiomyopathy, secondary ([place]) No date: Complication of anesthesia     Comment:  spinal anesthesia did not work for right TKR ended up               with general No date: Diverticulosis No date: Dysrhythmia No date: Erectile dysfunction No date: GERD (gastroesophageal reflux disease) No date: Hypertension No date: Lumbosacral radiculitis No date: Obesity No date: Osteoarthritis     Comment:  first metacarpal of left hand No date: Presence of permanent cardiac pacemaker No date: RLS (restless legs syndrome) No date: Sleep apnea No date: SOB (shortness of breath)   Reproductive/Obstetrics                             Anesthesia Physical  Anesthesia Plan  ASA: 3  Anesthesia Plan: General   Post-op Pain Management:    Induction: Intravenous  PONV Risk Score and Plan: 2 and TIVA and  Propofol infusion  Airway Management Planned: Natural Airway and Nasal Cannula  Additional Equipment:   Intra-op Plan:   Post-operative Plan:   Informed Consent: I have reviewed the patients History and Physical, chart, labs and discussed the procedure including the risks, benefits and alternatives for the proposed anesthesia with the patient or authorized representative who has indicated his/her understanding and acceptance.       Plan Discussed with:   Anesthesia Plan Comments:         Anesthesia Quick Evaluation

## 2021-03-15 NOTE — H&P (Signed)
Outpatient short stay form Pre-procedure 03/15/2021 10:11 AM Kyle Miyamoto MD, MPH  Primary Physician: Dr. Ellison Hughs  Reason for visit:  Surveillance colonoscopy  History of present illness:   70 y/o gentleman with history of hypertension, a. Fib on NOAC with last dose 3 days ago, knee replacements here for surveillance colonoscopy. Father had colonoscopy in his 85's he thinks. Last two colonoscopies without polyps. History of cholecystectomy. Discussed with patient based on GI society guidelines he has no indication for prophylactic antibiotic and they would actually put him at risk for developing severe infections so jointly decided not to administer them.    Current Facility-Administered Medications:    0.9 %  sodium chloride infusion, , Intravenous, Continuous, Amed Datta, Hilton Cork, MD, Last Rate: 20 mL/hr at 03/15/21 0920, New Bag at 03/15/21 0920  Medications Prior to Admission  Medication Sig Dispense Refill Last Dose   atorvastatin (LIPITOR) 10 MG tablet Take 10 mg by mouth daily.   03/14/2021   calcium carbonate (OSCAL) 1500 (600 Ca) MG TABS tablet Take 600 mg of elemental calcium by mouth daily.   03/14/2021   celecoxib (CELEBREX) 200 MG capsule Take 200 mg by mouth daily.   03/14/2021   Cholecalciferol (VITAMIN D) 2000 units tablet Take 2,000 Units by mouth daily.   03/14/2021   doxazosin (CARDURA) 8 MG tablet Take 8 mg by mouth daily.   03/14/2021   fluticasone (FLONASE) 50 MCG/ACT nasal spray Place 2 sprays into both nostrils daily.   03/14/2021   furosemide (LASIX) 40 MG tablet Take 40 mg by mouth daily as needed for fluid.    Past Week   gabapentin (NEURONTIN) 100 MG capsule Take 200 mg by mouth daily.   03/14/2021   lisinopril-hydrochlorothiazide (PRINZIDE,ZESTORETIC) 20-12.5 MG tablet Take 1 tablet by mouth 2 (two) times daily.    03/15/2021 at 0530   Multiple Vitamins-Minerals ([employer] SPECTRAVITE PO) Take 1 tablet by mouth daily.   03/14/2021   Omega-3 Fatty Acids (FISH OIL) 1200  MG CAPS Take 1,200 mg by mouth 2 (two) times a day.    Past Week   omeprazole (PRILOSEC) 40 MG capsule Take 40 mg by mouth daily.   03/14/2021   vitamin B-12 (CYANOCOBALAMIN) 1000 MCG tablet Take 1,000 mcg by mouth daily.   03/14/2021   vitamin C (ASCORBIC ACID) 500 MG tablet Take 500 mg by mouth daily.   03/14/2021   acetaminophen (TYLENOL) 500 MG tablet Take 1,000 mg by mouth every 6 (six) hours as needed for moderate pain or headache.      amoxicillin (AMOXIL) 500 MG tablet Take 500 mg by mouth. 4 capsules one hour prior to dental procedures      apixaban (ELIQUIS) 5 MG TABS tablet Take 5 mg by mouth 2 (two) times daily.   03/12/2021   HYDROcodone-acetaminophen (NORCO) 5-325 MG tablet Take 1 tablet by mouth every 6 (six) hours as needed for up to 20 doses. 20 tablet 0    hydrocortisone cream 1 % Apply 1 application topically daily as needed for itching.      Melatonin 10 MG TABS Take 10 mg by mouth at bedtime.        Allergies  Allergen Reactions   Antihistamines, Diphenhydramine-Type     Sleep loss, hallucinations    Antihistamines, Loratadine-Type    Band-Aid Plus Antibiotic [Bacitracin-Polymyxin B]    Tegaderm Ag Mesh [Silver]    Adhesive [Tape] Other (See Comments)    Blistering (Tegaderm)     Past Medical History:  Diagnosis  Date   Adenomatous polyp of colon    Anginal pain (HCC)    BPH (benign prostatic hyperplasia)    Cardiomyopathy, secondary ([place])    Complication of anesthesia    spinal anesthesia did not work for right TKR ended up with general   Diverticulosis    Dysrhythmia    Erectile dysfunction    GERD (gastroesophageal reflux disease)    Hypertension    Lumbosacral radiculitis    Obesity    Osteoarthritis    first metacarpal of left hand   Presence of permanent cardiac pacemaker    RLS (restless legs syndrome)    Sleep apnea    SOB (shortness of breath)     Review of systems:  Otherwise negative.    Physical Exam  Gen: Alert, oriented. Appears  stated age.  HEENT: PERRLA. Lungs: No respiratory distress CV: RRR Abd: soft, benign, no masses Ext: No edema    Planned procedures: Proceed with colonoscopy. The patient understands the nature of the planned procedure, indications, risks, alternatives and potential complications including but not limited to bleeding, infection, perforation, damage to internal organs and possible oversedation/side effects from anesthesia. The patient agrees and gives consent to proceed.  Please refer to procedure notes for findings, recommendations and patient disposition/instructions.     Kyle Miyamoto MD, MPH Gastroenterology 03/15/2021  10:11 AM

## 2021-03-15 NOTE — Transfer of Care (Signed)
Immediate Anesthesia Transfer of Care Note  Patient: [name]  Procedure(s) Performed: COLONOSCOPY WITH PROPOFOL  Patient Location: PACU  Anesthesia Type:General  Level of Consciousness: awake and alert   Airway & Oxygen Therapy: Patient Spontanous Breathing and Patient connected to nasal cannula oxygen  Post-op Assessment: Report given to RN and Post -op Vital signs reviewed and stable  Post vital signs: Reviewed and stable  Last Vitals:  Vitals Value Taken Time  BP 96/57 03/15/21 1052  Temp 36.3 C 03/15/21 1050  Pulse 62 03/15/21 1051  Resp 12 03/15/21 1051  SpO2 91 % 03/15/21 1051  Vitals shown include unvalidated device data.  Last Pain:  Vitals:   03/15/21 1050  TempSrc: Temporal  PainSc:          Complications: No notable events documented.

## 2021-03-15 NOTE — Anesthesia Postprocedure Evaluation (Signed)
Anesthesia Post Note  Patient: Nikholas Geffre Upperman  Procedure(s) Performed: COLONOSCOPY WITH PROPOFOL  Patient location during evaluation: Endoscopy Anesthesia Type: General Level of consciousness: awake and alert Pain management: pain level controlled Vital Signs Assessment: post-procedure vital signs reviewed and stable Respiratory status: spontaneous breathing, nonlabored ventilation, respiratory function stable and patient connected to nasal cannula oxygen Cardiovascular status: blood pressure returned to baseline and stable Postop Assessment: no apparent nausea or vomiting Anesthetic complications: no   No notable events documented.   Last Vitals:  Vitals:   03/15/21 0902 03/15/21 1050  BP: 136/81 (!) 96/57  Pulse: 78 66  Resp: 20 12  Temp: (!) 36.3 C (!) 36.3 C  SpO2: 97% 97%    Last Pain:  Vitals:   03/15/21 1050  TempSrc: Temporal  PainSc:                  Martha Clan

## 2021-03-16 ENCOUNTER — Encounter: Payer: Self-pay | Admitting: Gastroenterology

## 2021-03-16 LAB — SURGICAL PATHOLOGY

## 2021-09-05 ENCOUNTER — Ambulatory Visit: Payer: Medicare Other

## 2022-06-23 DIAGNOSIS — M1120 Other chondrocalcinosis, unspecified site: Secondary | ICD-10-CM | POA: Insufficient documentation

## 2022-08-07 ENCOUNTER — Ambulatory Visit
Admission: RE | Admit: 2022-08-07 | Discharge: 2022-08-07 | Disposition: A | Discharge status: Home / Self Care | Payer: Medicare Other | Source: Ambulatory Visit | Attending: Emergency Medicine | Admitting: Emergency Medicine

## 2022-08-07 VITALS — BP 144/73 | HR 74 | Temp 97.9°F | Resp 15

## 2022-08-07 DIAGNOSIS — R509 Fever, unspecified: Secondary | ICD-10-CM | POA: Insufficient documentation

## 2022-08-07 DIAGNOSIS — B349 Viral infection, unspecified: Secondary | ICD-10-CM | POA: Diagnosis present

## 2022-08-07 DIAGNOSIS — G44209 Tension-type headache, unspecified, not intractable: Secondary | ICD-10-CM | POA: Diagnosis present

## 2022-08-07 DIAGNOSIS — Z1152 Encounter for screening for COVID-19: Secondary | ICD-10-CM | POA: Diagnosis not present

## 2022-08-07 LAB — RESP PANEL BY RT-PCR (FLU A&B, COVID) ARPGX2
Influenza A by PCR: NEGATIVE
Influenza B by PCR: NEGATIVE
SARS Coronavirus 2 by RT PCR: NEGATIVE

## 2022-08-07 NOTE — ED Provider Notes (Signed)
[name]    CSN: [id number] Arrival date & time: 08/07/22  1048    HISTORY   Chief Complaint  Patient presents with   Chills    Developed severe headache and chills with low grade temp. Have body aches, generalized weakness. After chills stopped temp up to 101. Home covid negative. - Entered by patient   Headache   HPI [name] is a pleasant, 71 y.o. male who presents to urgent care today. Patient c/o a 1-day history of headache, fever Tmax 101, chills, body ache, generalized weakness.  Patient reports negative COVID-19 test today.  Has been taking OTC medication for relief.  Patient is afebrile on arrival with elevated blood pressure otherwise normal vital signs.  The history is provided by the patient.   Past Medical History:  Diagnosis Date   Adenomatous polyp of colon    Anginal pain ([place])    BPH (benign prostatic hyperplasia)    Cardiomyopathy, secondary ([place])    Complication of anesthesia    spinal anesthesia did not work for right TKR ended up with general   Diverticulosis    Dysrhythmia    Erectile dysfunction    GERD (gastroesophageal reflux disease)    Hypertension    Lumbosacral radiculitis    Obesity    Osteoarthritis    first metacarpal of left hand   Presence of permanent cardiac pacemaker    RLS (restless legs syndrome)    Sleep apnea    SOB (shortness of breath)    Patient Active Problem List   Diagnosis Date Noted   Pseudogout 06/23/2022   Chronic bilateral low back pain 11/17/2019   Carpal tunnel syndrome of left wrist 05/14/2019   Bilateral hand numbness 03/27/2019   Neck pain 03/27/2019   Anesthesia of skin 02/27/2019   Bilateral carpal tunnel syndrome 02/27/2019   Complete tear of right rotator cuff 10/31/2017   Tendinitis of upper biceps tendon of right shoulder 10/31/2017   Tendinitis of right rotator cuff 10/31/2017   Cardiac pacemaker 02/26/2017   Paroxysmal atrial fibrillation ([place]) 02/26/2017   Sick sinus  syndrome ([place]) 06/28/2016   Heart palpitations 04/24/2016   Premature ventricular contractions 04/24/2016   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   S/P total knee arthroplasty 10/25/2015   Cervical radicular pain 08/18/2015   Impaired glucose tolerance 08/18/2015   Restless leg syndrome 08/18/2015   Lumbosacral radiculitis 04/08/2015   Primary osteoarthritis of first carpometacarpal joint of left hand 04/08/2015   Benign prostatic hyperplasia 07/02/2014   Sleep apnea 07/02/2014   Seasonal allergies 07/02/2014   SOB (shortness of breath) 03/02/2014   Chest pain 12/25/2013   Morbid obesity with body mass index (BMI) of 40.0 or higher ([place]) 76/28/3151   Metabolic syndrome 76/16/0737   HTN (hypertension) 12/25/2013   GERD (gastroesophageal reflux disease) 12/25/2013   Erectile dysfunction 12/25/2013   Past Surgical History:  Procedure Laterality Date   CARPAL TUNNEL RELEASE Left 04/24/2019   Procedure: CARPAL TUNNEL RELEASE;  Surgeon: Hessie Knows, MD;  Location: [facility] ORS;  Service: Orthopedics;  Laterality: Left;   CHOLECYSTECTOMY     COLONOSCOPY WITH PROPOFOL N/A 06/24/2015   Procedure: COLONOSCOPY WITH PROPOFOL;  Surgeon: Hulen Luster, MD;  Location: [facility] ENDOSCOPY;  Service: Gastroenterology;  Laterality: N/A;   COLONOSCOPY WITH PROPOFOL N/A 03/15/2021   Procedure: COLONOSCOPY WITH PROPOFOL;  Surgeon: Lesly Rubenstein, MD;  Location: [facility] ENDOSCOPY;  Service: Endoscopy;  Laterality: N/A;  ELIQUIS - WILL NEED MEDICAL CLEARANCE   Excision of of lipoma  upper mid back   JOINT REPLACEMENT Bilateral    knees   KNEE ARTHROPLASTY Left 10/25/2015   Procedure: COMPUTER ASSISTED TOTAL KNEE ARTHROPLASTY;  Surgeon: Dereck Leep, MD;  Location: [facility] ORS;  Service: Orthopedics;  Laterality: Left;   Knee artthroscopy Bilateral    LIPOMA EXCISION     PACEMAKER INSERTION N/A 06/28/2016   Procedure: INSERTION PACEMAKER;  Surgeon: Isaias Cowman, MD;  Location: [facility] ORS;  Service: Cardiovascular;   Laterality: N/A;  left   Prostratate surgery     greenlight laser x 2   Right total knee arthroplasty Right 09/28/2010   SHOULDER ARTHROSCOPY WITH OPEN ROTATOR CUFF REPAIR Right 12/18/2017   Procedure: SHOULDER ARTHROSCOPY WITH OPEN ROTATOR CUFF REPAIR;  Surgeon: Corky Mull, MD;  Location: [facility] ORS;  Service: Orthopedics;  Laterality: Right;   VASECTOMY      Home Medications    Prior to Admission medications   Medication Sig Start Date End Date Taking? Authorizing Provider  amLODipine (NORVASC) 2.5 MG tablet Take by mouth. 08/03/22 08/03/23 Yes [provider]  benzonatate (TESSALON) 100 MG capsule Take 100 mg by mouth 3 (three) times daily as needed. 03/15/22  Yes [provider]  sodium chloride (OCEAN) 0.65 % nasal spray Place into the nose. 01/24/22 01/24/23 Yes [provider]  acetaminophen (TYLENOL) 500 MG tablet Take 1,000 mg by mouth every 6 (six) hours as needed for moderate pain or headache.    [provider]  amoxicillin (AMOXIL) 500 MG tablet Take 500 mg by mouth. 4 capsules one hour prior to dental procedures    [provider]  apixaban (ELIQUIS) 5 MG TABS tablet Take 5 mg by mouth 2 (two) times daily.    [provider]  atorvastatin (LIPITOR) 10 MG tablet Take 10 mg by mouth daily.    [provider]  calcium carbonate (OSCAL) 1500 (600 Ca) MG TABS tablet Take 600 mg of elemental calcium by mouth daily.    [provider]  celecoxib (CELEBREX) 200 MG capsule Take 200 mg by mouth daily.    [provider]  Cholecalciferol (VITAMIN D) 2000 units tablet Take 2,000 Units by mouth daily.    [provider]  doxazosin (CARDURA) 8 MG tablet Take 8 mg by mouth daily.    [provider]  fluticasone (FLONASE) 50 MCG/ACT nasal spray Place 2 sprays into both nostrils daily.    [provider]  furosemide (LASIX) 40 MG tablet Take 40 mg by mouth daily as needed for fluid.      [provider]  gabapentin (NEURONTIN) 100 MG capsule Take 200 mg by mouth daily.    [provider]  HYDROcodone-acetaminophen (NORCO) 5-325 MG tablet Take 1 tablet by mouth every 6 (six) hours as needed for up to 20 doses. 04/24/19   Hessie Knows, MD  hydrocortisone cream 1 % Apply 1 application topically daily as needed for itching.    [provider]  lisinopril-hydrochlorothiazide (PRINZIDE,ZESTORETIC) 20-12.5 MG tablet Take 1 tablet by mouth 2 (two) times daily.     [provider]  losartan-hydrochlorothiazide (HYZAAR) 100-25 MG tablet Take 1 tablet by mouth daily.    [provider]  Melatonin 10 MG TABS Take 10 mg by mouth at bedtime.    [provider]  Multiple Vitamins-Minerals ([employer] SPECTRAVITE PO) Take 1 tablet by mouth daily.    [provider]  Omega-3 Fatty Acids (FISH OIL) 1200 MG CAPS Take 1,200 mg by mouth 2 (two) times  a day.     [provider]  omeprazole (PRILOSEC) 40 MG capsule Take 40 mg by mouth daily.    [provider]  vitamin B-12 (CYANOCOBALAMIN) 1000 MCG tablet Take 1,000 mcg by mouth daily.    [provider]  vitamin C (ASCORBIC ACID) 500 MG tablet Take 500 mg by mouth daily.    [provider]    Family History Family History  Problem Relation Age of Onset   COPD Mother    Diabetes Father    Stroke Father    Heart disease Father    Social History Social History   Tobacco Use   Smoking status: Former    Packs/day: 1.00    Years: 5.00    Total pack years: 5.00    Types: Cigarettes    Quit date: 02/16/1974    Years since quitting: 48.5   Smokeless tobacco: Never   Tobacco comments:    quit 47 years ago  Vaping Use   Vaping Use: Never used  Substance Use Topics   Alcohol use: No   Drug use: No   Allergies   Antihistamines, diphenhydramine-type; Antihistamines, loratadine-type; Band-aid plus antibiotic [bacitracin-polymyxin b]; Tegaderm ag mesh  [silver]; and Adhesive [tape]  Review of Systems Review of Systems Pertinent findings revealed after performing a 14 point review of systems has been noted in the history of present illness.  Physical Exam Triage Vital Signs ED Triage Vitals  Enc Vitals Group     BP 07/15/21 0827 (!) 147/82     Pulse Rate 07/15/21 0827 72     Resp 07/15/21 0827 18     Temp 07/15/21 0827 98.3 F (36.8 C)     Temp Source 07/15/21 0827 Oral     SpO2 07/15/21 0827 98 %     Weight --      Height --      Head Circumference --      Peak Flow --      Pain Score 07/15/21 0826 5     Pain Loc --      Pain Edu? --      Excl. in [place]? --   No data found.  Updated Vital Signs BP (!) 144/73   Pulse 74   Temp 97.9 F (36.6 C)   Resp 15   SpO2 97%   Physical Exam Vitals and nursing note reviewed.  Constitutional:      General: He is not in acute distress.    Appearance: Normal appearance. He is not ill-appearing.  HENT:     Head: Normocephalic and atraumatic.     Salivary Glands: Right salivary gland is not diffusely enlarged or tender. Left salivary gland is not diffusely enlarged or tender.     Right Ear: Tympanic membrane, ear canal and external ear normal. No drainage. No middle ear effusion. There is no impacted cerumen. Tympanic membrane is not erythematous or bulging.     Left Ear: Tympanic membrane, ear canal and external ear normal. No drainage.  No middle ear effusion. There is no impacted cerumen. Tympanic membrane is not erythematous or bulging.     Nose: Nose normal. No nasal deformity, septal deviation, mucosal edema, congestion or rhinorrhea.     Right Turbinates: Not enlarged, swollen or pale.     Left Turbinates: Not enlarged, swollen or pale.     Right Sinus: No maxillary sinus tenderness or frontal sinus tenderness.     Left Sinus: No maxillary sinus tenderness or frontal sinus tenderness.  Mouth/Throat:     Lips: Pink. No lesions.     Mouth: Mucous membranes are moist. No oral  lesions.     Pharynx: Oropharynx is clear. Uvula midline. No posterior oropharyngeal erythema or uvula swelling.     Tonsils: No tonsillar exudate. 0 on the right. 0 on the left.  Eyes:     General: Lids are normal.        Right eye: No discharge.        Left eye: No discharge.     Extraocular Movements: Extraocular movements intact.     Conjunctiva/sclera: Conjunctivae normal.     Right eye: Right conjunctiva is not injected.     Left eye: Left conjunctiva is not injected.  Neck:     Trachea: Trachea and phonation normal.  Cardiovascular:     Rate and Rhythm: Normal rate and regular rhythm.     Pulses: Normal pulses.     Heart sounds: Normal heart sounds. No murmur heard.    No friction rub. No gallop.  Pulmonary:     Effort: Pulmonary effort is normal. No accessory muscle usage, prolonged expiration or respiratory distress.     Breath sounds: Normal breath sounds. No stridor, decreased air movement or transmitted upper airway sounds. No decreased breath sounds, wheezing, rhonchi or rales.  Chest:     Chest wall: No tenderness.  Musculoskeletal:        General: Normal range of motion.     Cervical back: Normal range of motion and neck supple. Spasms and tenderness present. No swelling, edema, deformity, erythema, signs of trauma, lacerations, rigidity, torticollis, bony tenderness or crepitus. Pain with movement present. Normal range of motion.  Lymphadenopathy:     Cervical: No cervical adenopathy.  Skin:    General: Skin is warm and dry.     Findings: No erythema or rash.  Neurological:     General: No focal deficit present.     Mental Status: He is alert and oriented to person, place, and time.  Psychiatric:        Mood and Affect: Mood normal.        Behavior: Behavior normal.     Visual Acuity Right Eye Distance:   Left Eye Distance:   Bilateral Distance:    Right Eye Near:   Left Eye Near:    Bilateral Near:     UC Couse / Diagnostics / Procedures:      Radiology No results found.  Procedures Procedures (including critical care time) EKG  Pending results:  Labs Reviewed  RESP PANEL BY RT-PCR (FLU A&B, COVID) [id number]    Medications Ordered in UC: Medications - No data to display  UC Diagnoses / Final Clinical Impressions(s)   I have reviewed the triage vital signs and the nursing notes.  Pertinent labs & imaging results that were available during my care of the patient were reviewed by me and considered in my medical decision making (see chart for details).    Final diagnoses:  Viral illness  Acute non intractable tension-type headache   ***  ED Prescriptions   None    PDMP not reviewed this encounter.  Disposition Upon Discharge:  Condition: stable for discharge home Home: take medications as prescribed; routine discharge instructions as discussed; follow up as advised.  Patient presented with an acute illness with associated systemic symptoms and significant discomfort requiring urgent management. In my opinion, this is a condition that a prudent lay person (someone who possesses an average knowledge of health  and medicine) may potentially expect to result in complications if not addressed urgently such as respiratory distress, impairment of bodily function or dysfunction of bodily organs.   Routine symptom specific, illness specific and/or disease specific instructions were discussed with the patient and/or caregiver at length.   As such, the patient has been evaluated and assessed, work-up was performed and treatment was provided in alignment with urgent care protocols and evidence based medicine.  Patient/parent/caregiver has been advised that the patient may require follow up for further testing and treatment if the symptoms continue in spite of treatment, as clinically indicated and appropriate.  If the patient was tested for COVID-19, Influenza and/or RSV, then the patient/parent/guardian was advised to isolate at  home pending the results of his/her diagnostic coronavirus test and potentially longer if they're positive. I have also advised pt that if his/her COVID-19 test returns positive, it's recommended to self-isolate for at least 10 days after symptoms first appeared AND until fever-free for 24 hours without fever reducer AND other symptoms have improved or resolved. Discussed self-isolation recommendations as well as instructions for household member/close contacts as per the [facility] and [place] DHHS, and also gave patient the [facility] packet with this information.  Patient/parent/caregiver has been advised to return to the [facility] or PCP in 3-5 days if no better; to PCP or the Emergency Department if new signs and symptoms develop, or if the current signs or symptoms continue to change or worsen for further workup, evaluation and treatment as clinically indicated and appropriate  The patient will follow up with their current PCP if and as advised. If the patient does not currently have a PCP we will assist them in obtaining one.   The patient may need specialty follow up if the symptoms continue, in spite of conservative treatment and management, for further workup, evaluation, consultation and treatment as clinically indicated and appropriate.  Patient/parent/caregiver verbalized understanding and agreement of plan as discussed.  All questions were addressed during visit.  Please see discharge instructions below for further details of plan.  Discharge Instructions:   Discharge Instructions      You are welcome to apply an ice pack to the back of your neck to relieve symptoms of tension headache.  After 20 minutes, you should feel significant relief of tension and a cool "rushing" sensation over the top of your head indicating release of tension and improved blood flow to your scalp.    You received a COVID-19 and influenza PCR test today.  The results of your PCR testing will be posted to your MyChart once it is  complete.  This typically takes 6 to 12 hours.    If your influenza PCR test is positive, you will be contacted by phone.  Please complete full 5-day course of Tamiflu, a prescription will be provided for you.     If your COVID-19 PCR test is positive, you will be contacted by phone.  Please discuss with the callback nurse whether or not you would benefit from antiviral therapy treatment for COVID-19.    If your COVID-19 PCR test is negative, please consider retesting in the next 2 to 3 days, particularly if you are not feeling any better.  You are welcome to return here to urgent care to have it done or you can take a home COVID-19 test.    If both COVID-19 tests are negative and your influenza test is negative, then your illness is likely due to one of the many less serious illnesses  that are circulating in our community right now.  Conservative care is recommended with rest, drinking plenty of clear fluids, eating only when hungry, eating supportive medications for your symptoms and avoiding being around other people.    Please remain at home until you are fever free for 24 hours without the use of antifever medications.  Please follow-up within the next 5-7 days either with your primary care provider or urgent care if your symptoms do not resolve.  If you do not have a primary care provider, we will assist you in finding one.        Thank you for visiting urgent care today.  We appreciate the opportunity to participate in your care.         This office note has been dictated using Museum/gallery curator.  Unfortunately, this method of dictation can sometimes lead to typographical or grammatical errors.  I apologize for your inconvenience in advance if this occurs.  Please do not hesitate to reach out to me if clarification is needed.

## 2022-08-07 NOTE — ED Triage Notes (Signed)
Pt. Presents to UC w/ c/o a headache that started 2 days ago and chills/fever that occurred yesterday. Pt. Has been taking OTC medication for relief.

## 2022-08-07 NOTE — ED Notes (Signed)
No finger splint applied to this pt. Error in charting

## 2022-08-07 NOTE — Discharge Instructions (Signed)
You are welcome to apply an ice pack to the back of your neck to relieve symptoms of tension headache.  After 20 minutes, you should feel significant relief of tension and a cool "rushing" sensation over the top of your head indicating release of tension and improved blood flow to your scalp.    You received a COVID-19 and influenza PCR test today.  The results of your PCR testing will be posted to your MyChart once it is complete.  This typically takes 6 to 12 hours.    If your influenza PCR test is positive, you will be contacted by phone.  Please complete full 5-day course of Tamiflu, a prescription will be provided for you.     If your COVID-19 PCR test is positive, you will be contacted by phone.  Please discuss with the callback nurse whether or not you would benefit from antiviral therapy treatment for COVID-19.    If your COVID-19 PCR test is negative, please consider retesting in the next 2 to 3 days, particularly if you are not feeling any better.  You are welcome to return here to urgent care to have it done or you can take a home COVID-19 test.    If both COVID-19 tests are negative and your influenza test is negative, then your illness is likely due to one of the many less serious illnesses that are circulating in our community right now.  Conservative care is recommended with rest, drinking plenty of clear fluids, eating only when hungry, eating supportive medications for your symptoms and avoiding being around other people.    Please remain at home until you are fever free for 24 hours without the use of antifever medications.  Please follow-up within the next 5-7 days either with your primary care provider or urgent care if your symptoms do not resolve.  If you do not have a primary care provider, we will assist you in finding one.        Thank you for visiting urgent care today.  We appreciate the opportunity to participate in your care.

## 2023-04-14 ENCOUNTER — Ambulatory Visit
Admission: EM | Admit: 2023-04-14 | Discharge: 2023-04-14 | Disposition: A | Discharge status: Home / Self Care | Payer: Medicare Other | Attending: Family Medicine | Admitting: Family Medicine

## 2023-04-14 ENCOUNTER — Ambulatory Visit: Payer: Medicare Other

## 2023-04-14 ENCOUNTER — Encounter: Payer: Self-pay | Admitting: Emergency Medicine

## 2023-04-14 DIAGNOSIS — M19031 Primary osteoarthritis, right wrist: Secondary | ICD-10-CM | POA: Diagnosis not present

## 2023-04-14 DIAGNOSIS — M25512 Pain in left shoulder: Secondary | ICD-10-CM

## 2023-04-14 DIAGNOSIS — M25531 Pain in right wrist: Secondary | ICD-10-CM | POA: Diagnosis not present

## 2023-04-14 DIAGNOSIS — W19XXXA Unspecified fall, initial encounter: Secondary | ICD-10-CM | POA: Diagnosis not present

## 2023-04-14 MED ORDER — TRAMADOL HCL 50 MG PO TABS: 50.0000 mg | ORAL_TABLET | Freq: Four times a day (QID) | ORAL | 0 refills | Status: DC | PRN

## 2023-04-14 NOTE — ED Triage Notes (Addendum)
Pt slipped yesterday evening. Pt c/o right wrist pain, left shoulder and elbow pain. Pt states he lightly hit the front of his head when he fell. No LOC. Denies headache or dizziness. He is taking Eliquis.

## 2023-04-14 NOTE — Discharge Instructions (Addendum)
Your xray showed a slight widening of scapholunate space, suspicious for scapholunate ligament injury.  Follow up with your orthopedic provider.  Wear your hard wrist brace daily and sleep in it for the next 7 days.  Take 1000 mg Tylenol 3 times a day for pain. Tramadol was sent to your pharmacy for additional pain relief. Continue your scheduled pain medications as previously prescribed.

## 2023-04-14 NOTE — ED Provider Notes (Signed)
[facility]    CSN: [id number] Arrival date & time: 04/14/23  1409      History   Chief Complaint Chief Complaint  Patient presents with   Fall    HPI  HPI CHIEF PERREAULT is a 72 y.o. male.   Erica presents after a fall last night. He was walking from the car on the wet driveway. He fell betweeen the firebed and the wall on the sunroom.  He injuried his right wrist and left shoulder. He has history of right wrist arthritis. He takes blood thinners and takes Celebrex. Says he scrapped his head on the gravel but did not have any bleeding. Denies headache, neck pain, blurry vision, chest pain, shortness of breath, fever. Dreysen has otherwise been in his normal state of health and has no other concerns.      Past Medical History:  Diagnosis Date   Adenomatous polyp of colon    Anginal pain (HCC)    BPH (benign prostatic hyperplasia)    Cardiomyopathy, secondary (HCC)    Complication of anesthesia    spinal anesthesia did not work for right TKR ended up with general   Diverticulosis    Dysrhythmia    Erectile dysfunction    GERD (gastroesophageal reflux disease)    Hypertension    Lumbosacral radiculitis    Obesity    Osteoarthritis    first metacarpal of left hand   Presence of permanent cardiac pacemaker    RLS (restless legs syndrome)    Sleep apnea    SOB (shortness of breath)     Patient Active Problem List   Diagnosis Date Noted   Pseudogout 06/23/2022   Chronic bilateral low back pain 11/17/2019   Carpal tunnel syndrome of left wrist 05/14/2019   Bilateral hand numbness 03/27/2019   Neck pain 03/27/2019   Anesthesia of skin 02/27/2019   Bilateral carpal tunnel syndrome 02/27/2019   Complete tear of right rotator cuff 10/31/2017   Tendinitis of upper biceps tendon of right shoulder 10/31/2017   Tendinitis of right rotator cuff 10/31/2017   Cardiac pacemaker 02/26/2017   Paroxysmal atrial fibrillation (HCC) 02/26/2017   Sick sinus  syndrome (HCC) 06/28/2016   Heart palpitations 04/24/2016   Premature ventricular contractions 04/24/2016   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   S/P total knee arthroplasty 10/25/2015   Cervical radicular pain 08/18/2015   Impaired glucose tolerance 08/18/2015   Restless leg syndrome 08/18/2015   Lumbosacral radiculitis 04/08/2015   Primary osteoarthritis of first carpometacarpal joint of left hand 04/08/2015   Benign prostatic hyperplasia 07/02/2014   Sleep apnea 07/02/2014   Seasonal allergies 07/02/2014   SOB (shortness of breath) 03/02/2014   Chest pain 12/25/2013   Morbid obesity with body mass index (BMI) of 40.0 or higher (HCC) 12/25/2013   Metabolic syndrome 12/25/2013   HTN (hypertension) 12/25/2013   GERD (gastroesophageal reflux disease) 12/25/2013   Erectile dysfunction 12/25/2013    Past Surgical History:  Procedure Laterality Date   CARPAL TUNNEL RELEASE Left 04/24/2019   Procedure: CARPAL TUNNEL RELEASE;  Surgeon: Kennedy Bucker, MD;  Location: [facility] ORS;  Service: Orthopedics;  Laterality: Left;   CHOLECYSTECTOMY     COLONOSCOPY WITH PROPOFOL N/A 06/24/2015   Procedure: COLONOSCOPY WITH PROPOFOL;  Surgeon: Wallace Cullens, MD;  Location: [facility] END[facility]COPY;  Service: Gastroenterology;  Laterality: N/A;   COLONOSCOPY WITH PROPOFOL N/A 03/15/2021   Procedure: COLONOSCOPY WITH PROPOFOL;  Surgeon: Regis Bill, MD;  Location: [facility] ENDOSCOPY;  Service: Endoscopy;  Laterality: N/A;  ELIQUIS - WILL NEED MEDICAL CLEARANCE   Excision of of lipoma     upper mid back   JOINT REPLACEMENT Bilateral    knees   KNEE ARTHROPLASTY Left 10/25/2015   Procedure: COMPUTER ASSISTED TOTAL KNEE ARTHROPLASTY;  Surgeon: Donato Heinz, MD;  Location: [facility] ORS;  Service: Orthopedics;  Laterality: Left;   Knee artthroscopy Bilateral    LIPOMA EXCISION     PACEMAKER INSERTION N/A 06/28/2016   Procedure: INSERTION PACEMAKER;  Surgeon: Marcina Millard, MD;  Location: [facility] OR[facility];  Service: Cardiovascular;   Laterality: N/A;  left   Prostratate surgery     greenlight laser x 2   Right total knee arthroplasty Right 09/28/2010   SHOULDER ARTHROSCOPY WITH OPEN ROTATOR CUFF REPAIR Right 12/18/2017   Procedure: SHOULDER ARTHROSCOPY WITH OPEN ROTATOR CUFF REPAIR;  Surgeon: Christena Flake, MD;  Location: [facility] ORS;  Service: Orthopedics;  Laterality: Right;   VASECTOMY         Home Medications    Prior to Admission medications   Medication Sig Start Date End Date Taking? Authorizing Provider  acetaminophen (TYLENOL) 500 MG tablet Take 1,000 mg by mouth every 6 (six) hours as needed for moderate pain or headache.   Yes [provider]  amLODipine (NORVASC) 2.5 MG tablet Take by mouth. 08/03/22 08/03/23 Yes [provider]  apixaban (ELIQUIS) 5 MG TABS tablet Take 5 mg by mouth 2 (two) times daily.   Yes [provider]  atorvastatin (LIPITOR) 10 MG tablet Take 10 mg by mouth daily.   Yes [provider]  celecoxib (CELEBREX) 200 MG capsule Take 200 mg by mouth daily.   Yes [provider]  Cholecalciferol (VITAMIN D) 2000 units tablet Take 2,000 Units by mouth daily.   Yes [provider]  doxazosin (CARDURA) 8 MG tablet Take 8 mg by mouth daily.   Yes [provider]  fluticasone (FLONASE) 50 MCG/ACT nasal spray Place 2 sprays into both nostrils daily.   Yes [provider]  furosemide (LASIX) 40 MG tablet Take 40 mg by mouth daily as needed for fluid.    Yes [provider]  gabapentin (NEURONTIN) 100 MG capsule Take 200 mg by mouth daily.   Yes [provider]  losartan-hydrochlorothiazide (HYZAAR) 100-25 MG tablet Take 1 tablet by mouth daily.   Yes [provider]  omeprazole (PRILOSEC) 40 MG capsule Take 40 mg by mouth daily.   Yes [provider]  traMADol (ULTRAM) 50 MG tablet Take 1 tablet (50 mg total) by mouth every 6 (six) hours as needed. 04/14/23  Yes Williamson Cavanah, DO  vitamin C  (ASCORBIC ACID) 500 MG tablet Take 500 mg by mouth daily.   Yes [provider]  amoxicillin (AMOXIL) 500 MG tablet Take 500 mg by mouth. 4 capsules one hour prior to dental procedures    [provider]  benzonatate (TESSALON) 100 MG capsule Take 100 mg by mouth 3 (three) times daily as needed. 03/15/22   [provider]  calcium carbonate (OSCAL) 1500 (600 Ca) MG TABS tablet Take 600 mg of elemental calcium by mouth daily.    [provider]  hydrocortisone cream 1 % Apply 1 application topically daily as needed for itching.    [provider]  lisinopril-hydrochlorothiazide (PRINZIDE,ZESTORETIC) 20-12.5 MG tablet Take 1 tablet by mouth 2 (two) times daily.     [provider]  Melatonin 10 MG TABS Take 10 mg by mouth at bedtime.    [provider]  Multiple Vitamins-Minerals ([employer] SPECTRAVITE PO) Take 1 tablet by mouth daily.    [provider]  Omega-3 Fatty Acids (FISH OIL) 1200 MG CAPS Take 1,200 mg by mouth 2 (two) times a day.     [provider]  sodium chloride (OCEAN) 0.65 % nasal spray Place into the nose. 01/24/22 01/24/23  [provider]  vitamin B-12 (CYANOCOBALAMIN) 1000 MCG tablet Take 1,000 mcg by mouth daily.    [provider]    Family History Family History  Problem Relation Age of Onset   COPD Mother    Diabetes Father    Stroke Father    Heart disease Father     Social History Social History   Tobacco Use   Smoking status: Former    Current packs/day: 0.00    Average packs/day: 1 pack/day for 5.0 years (5.0 ttl pk-yrs)    Types: Cigarettes    Start date: 02/16/1969    Quit date: 02/16/1974    Years since quitting: 49.2   Smokeless tobacco: Never   Tobacco comments:    quit 47 years ago  Vaping Use   Vaping status: Never Used  Substance Use Topics   Alcohol use: No   Drug use: No     Allergies   Antihistamines, diphenhydramine-type; Antihistamines,  loratadine-type; Band-aid plus antibiotic [bacitracin-polymyxin b]; Tegaderm ag mesh [silver]; and Adhesive [tape]   Review of Systems Review of Systems: :negative unless otherwise stated in HPI.      Physical Exam Triage Vital Signs ED Triage Vitals  Encounter Vitals Group     BP 04/14/23 1424 (!) 146/75     Systolic BP Percentile --      Diastolic BP Percentile --      Pulse Rate 04/14/23 1424 65     Resp 04/14/23 1424 16     Temp 04/14/23 1424 98.2 F (36.8 C)     Temp Source 04/14/23 1424 Oral     SpO2 04/14/23 1424 97 %     Weight 04/14/23 1422 296 lb 15.4 oz (134.7 kg)     Height 04/14/23 1422 5\' 11"  (1.803 m)     Head Circumference --      Peak Flow --      Pain Score 04/14/23 1421 5     Pain Loc --      Pain Education --      Exclude from Growth Chart --    No data found.  Updated Vital Signs BP (!) 146/75 (BP Location: Left Arm)   Pulse 65   Temp 98.2 F (36.8 C) (Oral)   Resp 16   Ht 5\' 11"  (1.803 m)   Wt 134.7 kg   SpO2 97%   BMI 41.42 kg/m   Visual Acuity Right Eye Distance:   Left Eye Distance:   Bilateral Distance:    Right Eye Near:   Left Eye Near:    Bilateral Near:     Physical Exam GEN: well appearing male in no acute distress  HENT:  abrasion on central forehead, no bruising, hematoma or bleeding  CVS: well perfused  RESP: speaking in full sentences without pause, no respiratory distress  MSK:   Left shoulder:  No evidence of bony deformity, asymmetry, or muscle atrophy. No tenderness over long head of biceps (bicipital groove).  +TTP at [facility] joint.  Good active and passive (ABD, ADD, Flexion, extension, IR, ER) though has pain with movement.  Strength 5/5 grip, elbow and shoulder. No abnormal scapular  function observed.  Sensation intact. Peripheral pulses intact.  Wrist, Right: Inspection yielded no erythema, ecchymosis, bony deformity, + mild swelling. Limited ROM 2/2 to acute pan: palpation is normal over metacarpals, scaphoid;  tendons, has no snuffbox tenderness. Strength 4/5 in all directions without pain.    UC Treatments / Results  Labs (all labs ordered are listed, but only abnormal results are displayed) Labs Reviewed - No data to display  EKG   Radiology DG Shoulder Left  Result Date: 04/14/2023 CLINICAL DATA:  Fall yesterday.  Left shoulder injury and pain. EXAM: LEFT SHOULDER - 2+ VIEW COMPARISON:  None Available. FINDINGS: There is no evidence of fracture or dislocation. There is no evidence of arthropathy or other focal bone abnormality. Soft tissues are unremarkable. IMPRESSION: Negative. Electronically Signed   By: Danae Orleans M.D.   On: 04/14/2023 15:15   DG Wrist Complete Right  Result Date: 04/14/2023 CLINICAL DATA:  Fall.  Right wrist injury and pain. EXAM: RIGHT WRIST - COMPLETE 3+ VIEW COMPARISON:  None Available. FINDINGS: There is no evidence of fracture or dislocation. Radiocarpal joint space narrowing is seen at the scaphoid, and mild degenerative spurring is seen involving the STT joint complex and base of thumb, consistent mild osteoarthritis. Widening of scapholunate space is suspicious for scapholunate ligament injury. IMPRESSION: No evidence of fracture or dislocation. Mild osteoarthritis. Widening of scapholunate space, suspicious for scapholunate ligament injury. Electronically Signed   By: Danae Orleans M.D.   On: 04/14/2023 15:14      Procedures Procedures (including critical care time)  Medications Ordered in UC Medications - No data to display  Initial Impression / Assessment and Plan / UC Course  I have reviewed the triage vital signs and the nursing notes.  Pertinent labs & imaging results that were available during my care of the patient were reviewed by me and considered in my medical decision making (see chart for details).      Pt is a 72 y.o.  male presents after a fall at home yesterday. Scrapped head on gravel but didn't bang his head during the fall.  Takes  blood thinners. Recommended CT scan but patient declined. States he will assume all risk if he has delayed care. No headache but has small abrasion on his forehead.   Right wrist TTP and left shoulder with TTP concerning for possible fracture.  On chart review, he had right carpal tunnel release performed at outside hospital in Aug 2020.    Obtained right wrist and left shoulder plain films.  Personally interpreted by me were unremarkable for fracture or dislocation. Radiologist report reviewed and additionally notes mild osteoarthritis, widening of right scapholunate space, suspicious for scapholunate ligament injury.  Reviewed results with pt. Advised him to wear his hard brace. Denies that he needs a new one.   Patient to gradually return to normal activities, as tolerated and continue ordinary activities within the limits permitted by pain. Prescribed Tramadol for pain relief.  Tylenol PRN. Advised patient to avoid OTC NSAIDs while taking prescription NSAID. Counseled patient on red flag symptoms and when to seek immediate care.    Patient to follow up with orthopedic provider, if symptoms do not improve with conservative treatment.  Return and ED precautions given. Understanding voiced. Discussed MDM, treatment plan and plan for follow-up with patient who agrees with plan.   Final Clinical Impressions(s) / UC Diagnoses   Final diagnoses:  Right wrist pain  Osteoarthritis of right wrist, unspecified osteoarthritis type  Acute pain of  left shoulder  Fall, initial encounter     Discharge Instructions      Your xray showed a slight widening of scapholunate space, suspicious for scapholunate ligament injury.  Follow up with your orthopedic provider.  Wear your hard wrist brace daily and sleep in it for the next 7 days.  Take 1000 mg Tylenol 3 times a day for pain. Tramadol was sent to your pharmacy for additional pain relief. Continue your scheduled pain medications as previously  prescribed.     ED Prescriptions     Medication Sig Dispense Auth. Provider   traMADol (ULTRAM) 50 MG tablet Take 1 tablet (50 mg total) by mouth every 6 (six) hours as needed. 12 tablet Jakylan Ron, DO      I have reviewed the PDMP during this encounter.   Katha Cabal, DO 04/22/23 [phone]

## 2023-08-10 ENCOUNTER — Other Ambulatory Visit: Payer: Self-pay | Admitting: Surgery

## 2023-08-13 ENCOUNTER — Encounter
Admission: RE | Admit: 2023-08-13 | Discharge: 2023-08-13 | Disposition: A | Discharge status: Home / Self Care | Payer: Medicare Other | Source: Ambulatory Visit | Attending: Surgery | Admitting: Surgery

## 2023-08-13 ENCOUNTER — Other Ambulatory Visit: Payer: Self-pay

## 2023-08-13 ENCOUNTER — Encounter: Payer: Self-pay | Admitting: Surgery

## 2023-08-13 DIAGNOSIS — Z950 Presence of cardiac pacemaker: Secondary | ICD-10-CM

## 2023-08-13 DIAGNOSIS — R0602 Shortness of breath: Secondary | ICD-10-CM

## 2023-08-13 DIAGNOSIS — Z01812 Encounter for preprocedural laboratory examination: Secondary | ICD-10-CM

## 2023-08-13 DIAGNOSIS — I480 Paroxysmal atrial fibrillation: Secondary | ICD-10-CM

## 2023-08-13 NOTE — Patient Instructions (Addendum)
Your procedure is scheduled on: 08/22/23 - Wednesday Report to the Registration Desk on the 1st floor of the [facility]. To find out your arrival time, please call [phone] between 1PM - 3PM on: 08/21/23 - Tuesday If your arrival time is 6:00 am, do not arrive before that time as the [facility] entrance doors do not open until 6:00 am.  REMEMBER: Instructions that are not followed completely may result in serious medical risk, up to and including death; or upon the discretion of your surgeon and anesthesiologist your surgery may need to be rescheduled.  Do not eat food after midnight the night before surgery.  No gum chewing or hard candies.  You may however, drink CLEAR liquids up to 2 hours before you are scheduled to arrive for your surgery. Do not drink anything within 2 hours of your scheduled arrival time.  Clear liquids include: - water  - apple juice without pulp - gatorade (not RED colors) - black coffee or tea (Do NOT add milk or creamers to the coffee or tea) Do NOT drink anything that is not on this list.  In addition, your doctor has ordered for you to drink the provided:  Ensure Pre-Surgery Clear Carbohydrate Drink  Drinking this carbohydrate drink up to two hours before surgery helps to reduce insulin resistance and improve patient outcomes. Please complete drinking 2 hours before scheduled arrival time.  One week prior to surgery: Stop Anti-inflammatories (NSAIDS) such as Advil, Aleve, Ibuprofen, Motrin, Naproxen, Naprosyn and Aspirin based products such as Excedrin, Goody's Powder, BC Powder. You may however, continue to take Tylenol if needed for pain up until the day of surgery.  Stop beginning 08/15/23 ANY OVER THE COUNTER supplements until after surgery.  apixaban [facility]) you will be made aware of when to stop this medication.   ON THE DAY OF SURGERY ONLY TAKE THESE MEDICATIONS WITH SIPS OF WATER:  amLODipine (NORVASC)  fluticasone (FLONASE)   gabapentin (NEURONTIN)  hydroxychloroquine  pantoprazole (PROTONIX)   Bring your CPAP machine with you to the hospital.  No Alcohol for 24 hours before or after surgery.  No Smoking including e-cigarettes for 24 hours before surgery.  No chewable tobacco products for at least 6 hours before surgery.  No nicotine patches on the day of surgery.  Do not use any "recreational" drugs for at least a week (preferably 2 weeks) before your surgery.  Please be advised that the combination of cocaine and anesthesia may have negative outcomes, up to and including death. If you test positive for cocaine, your surgery will be cancelled.  On the morning of surgery brush your teeth with toothpaste and water, you may rinse your mouth with mouthwash if you wish. Do not swallow any toothpaste or mouthwash.  Use CHG Soap or wipes as directed on instruction sheet.  Do not wear jewelry, make-up, hairpins, clips or nail polish.  For welded (permanent) jewelry: bracelets, anklets, waist bands, etc.  Please have this removed prior to surgery.  If it is not removed, there is a chance that hospital personnel will need to cut it off on the day of surgery.  Do not wear lotions, powders, or perfumes.   Do not shave body hair from the neck down 48 hours before surgery.  Contact lenses, hearing aids and dentures may not be worn into surgery.  Do not bring valuables to the hospital. [facility] is not responsible for any missing/lost belongings or valuables.   Notify your doctor if there is any  change in your medical condition (cold, fever, infection).  Wear comfortable clothing (specific to your surgery type) to the hospital.  After surgery, you can help prevent lung complications by doing breathing exercises.  Take deep breaths and cough every 1-2 hours. Your doctor may order a device called an Incentive Spirometer to help you take deep breaths. When coughing or sneezing, hold a pillow firmly against your  incision with both hands. This is called "splinting." Doing this helps protect your incision. It also decreases belly discomfort.  If you are being admitted to the hospital overnight, leave your suitcase in the car. After surgery it may be brought to your room.  In case of increased patient census, it may be necessary for you, the patient, to continue your postoperative care in the Same Day Surgery department.  If you are being discharged the day of surgery, you will not be allowed to drive home. You will need a responsible individual to drive you home and stay with you for 24 hours after surgery.   If you are taking public transportation, you will need to have a responsible individual with you.  Please call the Pre-admissions Testing Dept. at [phone] if you have any questions about these instructions.  Surgery Visitation Policy:  Patients having surgery or a procedure may have two visitors.  Children under the age of 57 must have an adult with them who is not the patient.  Inpatient Visitation:    Visiting hours are 7 a.m. to 8 p.m. Up to four visitors are allowed at one time in a patient room. The visitors may rotate out with other people during the day.  One visitor age 76 or older may stay with the patient overnight and must be in the room by 8 p.m.     Preparing for Surgery with CHLORHEXIDINE GLUCONATE (CHG) Soap  Chlorhexidine Gluconate (CHG) Soap  o An antiseptic cleaner that kills germs and bonds with the skin to continue killing germs even after washing  o Used for showering the night before surgery and morning of surgery  Before surgery, you can play an important role by reducing the number of germs on your skin.  CHG (Chlorhexidine gluconate) soap is an antiseptic cleanser which kills germs and bonds with the skin to continue killing germs even after washing.  Please do not use if you have an allergy to CHG or antibacterial soaps. If your skin becomes  reddened/irritated stop using the CHG.  1. Shower the NIGHT BEFORE SURGERY and the MORNING OF SURGERY with CHG soap.  2. If you choose to wash your hair, wash your hair first as usual with your normal shampoo.  3. After shampooing, rinse your hair and body thoroughly to remove the shampoo.  4. Use CHG as you would any other liquid soap. You can apply CHG directly to the skin and wash gently with a scrungie or a clean washcloth.  5. Apply the CHG soap to your body only from the neck down. Do not use on open wounds or open sores. Avoid contact with your eyes, ears, mouth, and genitals (private parts). Wash face and genitals (private parts) with your normal soap.  6. Wash thoroughly, paying special attention to the area where your surgery will be performed.  7. Thoroughly rinse your body with warm water.  8. Do not shower/wash with your normal soap after using and rinsing off the CHG soap.  9. Pat yourself dry with a clean towel.  10. Wear clean pajamas to  bed the night before surgery.  12. Place clean sheets on your bed the night of your first shower and do not sleep with pets.  13. Shower again with the CHG soap on the day of surgery prior to arriving at the hospital.  14. Do not apply any deodorants/lotions/powders.  15. Please wear clean clothes to the hospital.

## 2023-08-14 ENCOUNTER — Encounter
Admission: RE | Admit: 2023-08-14 | Discharge: 2023-08-14 | Disposition: A | Discharge status: Home / Self Care | Payer: Medicare Other | Source: Ambulatory Visit | Attending: Surgery | Admitting: Surgery

## 2023-08-14 DIAGNOSIS — Z950 Presence of cardiac pacemaker: Secondary | ICD-10-CM | POA: Insufficient documentation

## 2023-08-14 DIAGNOSIS — I480 Paroxysmal atrial fibrillation: Secondary | ICD-10-CM | POA: Insufficient documentation

## 2023-08-14 DIAGNOSIS — Z01818 Encounter for other preprocedural examination: Secondary | ICD-10-CM | POA: Insufficient documentation

## 2023-08-14 DIAGNOSIS — Z01812 Encounter for preprocedural laboratory examination: Secondary | ICD-10-CM

## 2023-08-14 DIAGNOSIS — Z01810 Encounter for preprocedural cardiovascular examination: Secondary | ICD-10-CM | POA: Diagnosis not present

## 2023-08-14 DIAGNOSIS — R0602 Shortness of breath: Secondary | ICD-10-CM | POA: Insufficient documentation

## 2023-08-14 LAB — CBC
HCT: 36.9 % — ABNORMAL LOW (ref 39.0–52.0)
Hemoglobin: 13.2 g/dL (ref 13.0–17.0)
MCH: 30.9 pg (ref 26.0–34.0)
MCHC: 35.8 g/dL (ref 30.0–36.0)
MCV: 86.4 fL (ref 80.0–100.0)
Platelets: 176 10*3/uL (ref 150–400)
RBC: 4.27 MIL/uL (ref 4.22–5.81)
RDW: 12.4 % (ref 11.5–15.5)
WBC: 5 10*3/uL (ref 4.0–10.5)
nRBC: 0 % (ref 0.0–0.2)

## 2023-08-14 LAB — BASIC METABOLIC PANEL
Anion gap: 8 (ref 5–15)
BUN: 12 mg/dL (ref 8–23)
CO2: 27 mmol/L (ref 22–32)
Calcium: 9.2 mg/dL (ref 8.9–10.3)
Chloride: 98 mmol/L (ref 98–111)
Creatinine, Ser: 0.8 mg/dL (ref 0.61–1.24)
GFR, Estimated: >60 mL/min (ref >60)
Glucose, Bld: 126 mg/dL — ABNORMAL HIGH (ref 70–99)
Potassium: 3.9 mmol/L (ref 3.5–5.1)
Sodium: 133 mmol/L — ABNORMAL LOW (ref 135–145)

## 2023-08-14 NOTE — Pre-Procedure Instructions (Signed)
Mr. Asad made aware by this writer to hold Eliquis per Dr. Darrold Junker order, 2 days prior to his surgery and the morning of, beginning 08/20/23, he voices understanding that he will resume taking it with MD orders following his surgery.

## 2023-08-15 ENCOUNTER — Encounter: Payer: Self-pay | Admitting: Surgery

## 2023-08-15 NOTE — Progress Notes (Signed)
Perioperative / Anesthesia Services  Pre-Admission Testing Clinical Review / Pre-Operative Anesthesia Consult  Date: 08/20/23  Patient Demographics:  Name: [name] DOB:   30-Oct-1950 MRN:   [id number]  Planned Surgical Procedure(s):    Case: [id number] Date/Time: 08/22/23 1454   Procedure: CARPAL TUNNEL RELEASE ENDOSCOPIC (Right: Wrist)   Anesthesia type: Choice   Pre-op diagnosis: CARPAL TUNNEL SYNDROME, RIGHT.   Location: [facility] OR ROOM 02 / [facility]   Surgeons: Christena Flake, MD     NOTE: Available PAT nursing documentation and vital signs have been reviewed. Clinical nursing staff has updated patient's PMH/PSHx, current medication list, and drug allergies/intolerances to ensure comprehensive history available to assist in medical decision making as it pertains to the aforementioned surgical procedure and anticipated anesthetic course. Extensive review of available clinical information personally performed. [place] PMH and PSHx updated with any diagnoses/procedures that  may have been inadvertently omitted during his intake with the pre-admission testing department's nursing staff.  Clinical Discussion:  [name] is a 72 y.o. male who is submitted for pre-surgical anesthesia review and clearance prior to him undergoing the above procedure. Patient is a Former Smoker (5 pack years; quit 02/1974). Pertinent PMH includes: SSS (s/p PPM placement), PAF, secondary cardiomyopathy, palpitations, angina, HTN, SOB, OSAH (requires nocturnal PAP therapy), OA, BILATERAL carpal tunnel syndrome, cervicalgia, lumbar DDD.  Patient is followed by cardiology Darrold Junker, MD). He was last seen in the cardiology clinic on 07/03/2023; notes reviewed. At the time of his clinic visit, patient doing well overall from a cardiovascular perspective. Patient denied any chest pain, shortness of breath, PND, orthopnea, palpitations, significant peripheral edema, weakness, fatigue,  vertiginous symptoms, or presyncope/syncope. Patient with a past medical history significant for cardiovascular diagnoses. Documented physical exam was grossly benign, providing no evidence of acute exacerbation and/or decompensation of the patient's known cardiovascular conditions.  TTE was performed on 05/10/2016 revealing a normal left ventricular systolic function with an EF of >55%.  There were no regional wall motion abnormalities.  There was mild LVH.  Left atrium mildly enlarged.  There was trivial to mild aortic, mitral, and tricuspid valve regurgitation.  RVSP = 34.0 mmHg. All transvalvular gradients were noted to be normal providing no evidence suggestive of valvular stenosis. Aorta normal in size with no evidence of aneurysmal dilatation.  Patient with a history of sick sinus syndrome that ultimately required placement of a permanent pacemaker.  Patient underwent placement of a Medtronic [id number] dual-chamber pacemaker on 06/28/2016.  Device is regularly interrogated by patient's primary cardiology/electrophysiology team.  Last interrogation was on 07/24/2023 at which time device was noted to be functioning properly.  Patient with an atrial fibrillation diagnosis; CHA2DS2-VASc Score = 2 (age, HTN).currently, cardiac rate and rhythm are being maintained intrinsically without pharmacological intervention.  Patient is chronically anticoagulated using standard dose DOAC (apixaban). Patient is reported be compliant with therapy with no evidence reports of GI bleeding.  Blood pressure well-controlled at 124/72 mmHg on currently prescribed CCB (amlodipine), alpha-blocker (doxazosin), diuretic (furosemide + HCTZ) and ARB (losartan) therapies.  Patient does not take any type of prescribed lipid-lowering therapies for ASCVD prevention, however he does take an over-the-counter omega-3 fatty acid capsule.  Patient does have an OSAH diagnosis and is reported to be compliant with prescribed nocturnal PAP therapy.   Patient making an effort to maintain an active lifestyle; walks daily.  He is able to complete all of his ADLs/IADLs independently without cardiovascular limitation.  Per the [facility], patient is  able to exceed 4 METS of physical activity without experiencing any significant angina/anginal equivalent symptoms.  No changes were made to his medication regimen.  Patient to follow-up with outpatient cardiology in 6 months or sooner if needed.  [name] is scheduled for an elective CARPAL TUNNEL RELEASE ENDOSCOPIC (Right: Wrist) on 08/22/2023 with Dr. Leron Croak, MD.  Given patient's past medical history significant for cardiovascular diagnoses, presurgical cardiac clearance was sought by the PAT team. Per cardiology, "this patient is optimized for surgery and may proceed with the planned procedural course with a LOW risk of significant perioperative cardiovascular complications".  Again, this patient is on daily oral anticoagulation therapy using a DOAC medication. He has been instructed on recommendations for holding his apixaban for 2 days prior to his procedure with plans to restart as soon as postoperative bleeding risk felt to be minimized by his attending surgeon. The patient has been instructed that his last dose of his apixaban should be on 08/19/2023.  Patient reports previous perioperative complications with anesthesia in the past. Patient reporting (+) ineffective neuraxial anesthetic course with prior knee arthroplasty. Ultimately case was converted to general anesthesia. In review of the available records, it is noted that patient underwent a general anesthetic course here at [facility] ([facility] in 02/2021 without documented complications.      04/14/2023    2:24 PM 04/14/2023    2:22 PM 08/07/2022   11:15 AM  Vitals with BMI  Height  5\' 11"    Weight  296 lbs 15 oz   BMI  41.44   Systolic 146  144  Diastolic 75  73  Pulse 65  74     Providers/Specialists:   NOTE: Primary physician provider listed below. Patient may have been seen by APP or partner within same practice.   PROVIDER ROLE / SPECIALTY LAST OV  Poggi, Excell Seltzer, MD Orthopedics (Surgeon)  08/01/2023  Marina Goodell, MD Primary Care Provider  04/03/2023  Marcina Millard, MD Cardiology  06/23/2023  Theora Master, MD Neurology 07/25/2023  Gerrie Nordmann, MD Rheumatology 07/03/2023   Allergies:  Antihistamines, diphenhydramine-type; Antihistamines, loratadine-type; Band-aid plus antibiotic [bacitracin-polymyxin b]; Tegaderm ag mesh [silver]; and Adhesive [tape]  Current Home Medications:   No current facility-administered medications for this encounter.    acetaminophen (TYLENOL) 500 MG tablet   amLODipine (NORVASC) 2.5 MG tablet   apixaban (ELIQUIS) 5 MG TABS tablet   calcium carbonate (OSCAL) 1500 (600 Ca) MG TABS tablet   celecoxib (CELEBREX) 200 MG capsule   Cholecalciferol (VITAMIN D) 2000 units tablet   doxazosin (CARDURA) 8 MG tablet   fluticasone (FLONASE) 50 MCG/ACT nasal spray   furosemide (LASIX) 40 MG tablet   gabapentin (NEURONTIN) 100 MG capsule   gabapentin (NEURONTIN) 400 MG capsule   hydrocortisone cream 1 %   hydroxychloroquine (PLAQUENIL) 200 MG tablet   losartan-hydrochlorothiazide (HYZAAR) 100-25 MG tablet   Multiple Vitamins-Minerals ([employer] SPECTRAVITE PO)   NON FORMULARY   Omega-3 Fatty Acids (FISH OIL) 1200 MG CAPS   vitamin C (ASCORBIC ACID) 500 MG tablet   amoxicillin (AMOXIL) 500 MG tablet   pantoprazole (PROTONIX) 40 MG tablet   traMADol (ULTRAM) 50 MG tablet   History:   Past Medical History:  Diagnosis Date   Adenomatous polyp of colon    Anginal pain (HCC)    Bilateral carpal tunnel syndrome    BPH (benign prostatic hyperplasia)    Cardiomyopathy, secondary (HCC)    Cervicalgia    Cholelithiasis  Complication of anesthesia    a.) ineffective neuraxial anesthesia for RIGHT TKR; converted to GA  course   DDD (degenerative disc disease), lumbar    Diverticulosis    Erectile dysfunction    GERD (gastroesophageal reflux disease)    History of echocardiogram 05/10/2016   a.) TTE 05/10/2016: EF >55%, mild LVH, mild LAE, normal RVSF, triv AR, mild MR/TR   Hypertension    Long term current use of immunosuppressive drug    a.) hydroxychloroquine for pseudogout   Lumbosacral radiculitis    Obesity    On apixaban therapy    OSA on CPAP    Osteoarthritis    PAF (paroxysmal atrial fibrillation) (HCC)    a.) CHA2DS2-VASc = 2 (age, HTN) as of 08/15/2023; b.) cardiac rate/rhythm maintained intrinsically without pharmacological intervention; chronically anticoagulated using apixaban   Palpitations (PAC/PVCs)    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/2016   a.) Medtronic A2DRO1 dual-chamber PPM (MRI compatiable)   Pseudogout    Renal cyst, right    RLS (restless legs syndrome)    Sinus bradycardia    SOB (shortness of breath)    SSS (sick sinus syndrome) (HCC)    a.) s/p dual chamber PPM placement 06/28/2016   Past Surgical History:  Procedure Laterality Date   CARPAL TUNNEL RELEASE Left 04/24/2019   Procedure: CARPAL TUNNEL RELEASE;  Surgeon: Kennedy Bucker, MD;  Location: [facility] ORS;  Service: Orthopedics;  Laterality: Left;   CHOLECYSTECTOMY     COLONOSCOPY WITH PROPOFOL N/A 06/24/2015   Procedure: COLONOSCOPY WITH PROPOFOL;  Surgeon: Wallace Cullens, MD;  Location: [facility] END[facility]COPY;  Service: Gastroenterology;  Laterality: N/A;   COLONOSCOPY WITH PROPOFOL N/A 03/15/2021   Procedure: COLONOSCOPY WITH PROPOFOL;  Surgeon: Regis Bill, MD;  Location: [facility] ENDOSCOPY;  Service: Endoscopy;  Laterality: N/A;  ELIQUIS - WILL NEED MEDICAL CLEARANCE   KNEE ARTHROPLASTY Left 10/25/2015   Procedure: COMPUTER ASSISTED TOTAL KNEE ARTHROPLASTY;  Surgeon: Donato Heinz, MD;  Location: [facility] ORS;  Service: Orthopedics;  Laterality: Left;   KNEE ARTHROSCOPY Bilateral    LIPOMA EXCISION N/A     upper/mid aspect of back   PACEMAKER INSERTION N/A 06/28/2016   Procedure: INSERTION PACEMAKER;  Surgeon: Marcina Millard, MD;  Location: [facility] ORS;  Service: Cardiovascular;  Laterality: N/A;  left   PHOTOSELECTIVE VAPORIZATION OF THE PROSTATE (GREENLIGHT LASER) N/A    x 2   SHOULDER ARTHROSCOPY WITH OPEN ROTATOR CUFF REPAIR Right 12/18/2017   Procedure: SHOULDER ARTHROSCOPY WITH OPEN ROTATOR CUFF REPAIR;  Surgeon: Christena Flake, MD;  Location: [facility] ORS;  Service: Orthopedics;  Laterality: Right;   TOTAL KNEE ARTHROPLASTY Right 09/28/2010   VASECTOMY     Family History  Problem Relation Age of Onset   COPD Mother    Diabetes Father    Stroke Father    Heart disease Father    Social History   Tobacco Use   Smoking status: Former    Current packs/day: 0.00    Average packs/day: 1 pack/day for 5.0 years (5.0 ttl pk-yrs)    Types: Cigarettes    Start date: 02/16/1969    Quit date: 02/16/1974    Years since quitting: 49.5   Smokeless tobacco: Never   Tobacco comments:    quit 47 years ago  Vaping Use   Vaping status: Never Used  Substance Use Topics   Alcohol use: No   Drug use: No    Pertinent Clinical Results:  LABS:   No visits with results within 3 Day(s)  from this visit.  Latest known visit with results is:  Hospital Outpatient Visit on 08/14/2023  Component Date Value Ref Range Status   WBC 08/14/2023 5.0  4.0 - 10.5 K/uL Final   RBC 08/14/2023 4.27  4.22 - 5.81 MIL/uL Final   Hemoglobin 08/14/2023 13.2  13.0 - 17.0 g/dL Final   HCT 84/69/6295 36.9 (L)  39.0 - 52.0 % Final   MCV 08/14/2023 86.4  80.0 - 100.0 fL Final   MCH 08/14/2023 30.9  26.0 - 34.0 pg Final   MCHC 08/14/2023 35.8  30.0 - 36.0 g/dL Final   RDW 28/41/3244 12.4  11.5 - 15.5 % Final   Platelets 08/14/2023 176  150 - 400 K/uL Final   nRBC 08/14/2023 0.0  0.0 - 0.2 % Final   Performed at [facility], [address] [place], [place] [address]   Sodium 08/14/2023 133 (L)  135 - 145  mmol/L Final   Potassium 08/14/2023 3.9  3.5 - 5.1 mmol/L Final   Chloride 08/14/2023 98  98 - 111 mmol/L Final   CO2 08/14/2023 27  22 - 32 mmol/L Final   Glucose, Bld 08/14/2023 126 (H)  70 - 99 mg/dL Final   Glucose reference range applies only to samples taken after fasting for at least 8 hours.   BUN 08/14/2023 12  8 - 23 mg/dL Final   Creatinine, Ser 08/14/2023 0.80  0.61 - 1.24 mg/dL Final   Calcium 25/36/6440 9.2  8.9 - 10.3 mg/dL Final   GFR, Estimated 08/14/2023 >60  >60 mL/min Final   Comment: (NOTE) Calculated using the CKD-EPI Creatinine Equation (2021)    Anion gap 08/14/2023 8  5 - 15 Final   Performed at [name], [address] [place], [place] [address]    ECG: Date: 08/14/2023 Time ECG obtained: 1002 AM Rate: 70 bpm Rhythm: normal sinus Axis (leads I and aVF): Normal Intervals: PR 184 ms. QRS 88 ms. QTc 444 ms. ST segment and T wave changes: No evidence of acute ST segment elevation or depression.   Comparison: Previous tracing on 04/21/2019 showed an atrial paced rhythm with prolonged AV conduction   IMAGING / PROCEDURES: DG WRIST COMPLETE RIGHT performed on 04/14/2023 No evidence of fracture or dislocation. Mild osteoarthritis. Widening of scapholunate space, suspicious for scapholunate ligament injury.  TRANSTHORACIC ECHOCARDIOGRAM performed on 05/10/2016 Normal left ventricular systolic function with an EF of >55% Mild LVH Mild LEFT atrial enlargement Normal right ventricular size and function Trivial AR Mild MR and TR Normal gradients; no valvular stenosis No pericardial effusion  MYOCARDIAL PERFUSION IMAGING STUDY (LEXISCAN) performed on 04/23/2013 Exercise myocardial perfusion study with no significant ischemia.  No significant wall motion abnormality noted.  The estimated ejection fraction is 53%.  There are no EKG changes concerning for ischemia.  There is attenuation artifact noted on this study.   Impression and Plan:   [name] has been referred for pre-anesthesia review and clearance prior to him undergoing the planned anesthetic and procedural courses. Available labs, pertinent testing, and imaging results were personally reviewed by me in preparation for upcoming operative/procedural course. [name] medical record has been updated following extensive record review and patient interview with PAT staff.   This patient has been appropriately cleared by cardiology with an overall LOW risk of experiencing significant perioperative cardiovascular complications. Completed perioperative prescription for cardiac device management documentation completed by primary cardiology team and placed on patient's chart for review by the surgical/anesthetic team on the day  of his procedure. Electrophysiology indicating that procedure should not interfere with planned surgical procedure. Beyond normal perioperative cardiovascular monitoring, there are no recommendations from electrophysiology team that prompt further discussion/recommendations from industry representative.   Based on clinical review performed today (08/20/23), barring any significant acute changes in the patient's overall condition, it is anticipated that he will be able to proceed with the planned surgical intervention. Any acute changes in clinical condition may necessitate his procedure being postponed and/or cancelled. Patient will meet with anesthesia team (MD and/or CRNA) on the day of his procedure for preoperative evaluation/assessment. Questions regarding anesthetic course will be fielded at that time.   Pre-surgical instructions were reviewed with the patient during his PAT appointment, and questions were fielded to satisfaction by PAT clinical staff. He has been instructed on which medications that he will need to hold prior to surgery, as well as the ones that have been deemed safe/appropriate to take on the day of his procedure. As part of the  general education provided by PAT, patient made aware both verbally and in writing, that he would need to abstain from the use of any illegal substances during his perioperative course.  He was advised that failure to follow the provided instructions could necessitate case cancellation or result in serious perioperative complications up to and including death. Patient encouraged to contact PAT and/or his surgeon's office to discuss any questions or concerns that may arise prior to surgery; verbalized understanding.   Quentin Mulling, MSN, APRN, FNP-C, CEN [name]  Perioperative Services Nurse Practitioner Phone: [phone] Fax: [phone] 08/20/23 4:05 PM  NOTE: This note has been prepared using Dragon dictation software. Despite my best ability to proofread, there is always the potential that unintentional transcriptional errors may still occur from this process.

## 2023-08-21 MED ORDER — CHLORHEXIDINE GLUCONATE 0.12 % MT SOLN
15.0000 mL | Freq: Once | OROMUCOSAL | Status: AC
  Administered 2023-08-22: 15 mL via OROMUCOSAL

## 2023-08-21 MED ORDER — ORAL CARE MOUTH RINSE: 15.0000 mL | Freq: Once | OROMUCOSAL | Status: AC

## 2023-08-21 MED ORDER — LACTATED RINGERS IV SOLN: INTRAVENOUS | Status: DC

## 2023-08-21 MED ORDER — CEFAZOLIN SODIUM-DEXTROSE 2-4 GM/100ML-% IV SOLN
2.0000 g | INTRAVENOUS | Status: AC
  Administered 2023-08-22: 2 g via INTRAVENOUS

## 2023-08-22 ENCOUNTER — Other Ambulatory Visit: Payer: Self-pay

## 2023-08-22 ENCOUNTER — Ambulatory Visit
Admission: RE | Admit: 2023-08-22 | Discharge: 2023-08-22 | Disposition: A | Discharge status: Home / Self Care | Payer: Medicare Other | Attending: Surgery | Admitting: Surgery

## 2023-08-22 ENCOUNTER — Encounter
Admission: RE | Disposition: A | Discharge status: Home / Self Care | Payer: Self-pay | Source: Home / Self Care | Attending: Surgery

## 2023-08-22 ENCOUNTER — Ambulatory Visit: Payer: Medicare Other | Admitting: Urgent Care

## 2023-08-22 ENCOUNTER — Encounter: Payer: Self-pay | Admitting: Surgery

## 2023-08-22 DIAGNOSIS — G5601 Carpal tunnel syndrome, right upper limb: Secondary | ICD-10-CM | POA: Insufficient documentation

## 2023-08-22 HISTORY — DX: Carpal tunnel syndrome, bilateral upper limbs: G56.03

## 2023-08-22 HISTORY — DX: Cyst of kidney, acquired: N28.1

## 2023-08-22 HISTORY — PX: CARPAL TUNNEL RELEASE: SHX101

## 2023-08-22 HISTORY — DX: Other long term (current) drug therapy: Z79.899

## 2023-08-22 HISTORY — DX: Long term (current) use of unspecified immunomodulators and immunosuppressants: Z79.60

## 2023-08-22 HISTORY — DX: Long term (current) use of anticoagulants: Z79.01

## 2023-08-22 HISTORY — DX: Palpitations: R00.2

## 2023-08-22 HISTORY — DX: Calculus of gallbladder without cholecystitis without obstruction: K80.20

## 2023-08-22 HISTORY — DX: Obstructive sleep apnea (adult) (pediatric): G47.33

## 2023-08-22 HISTORY — DX: Bradycardia, unspecified: R00.1

## 2023-08-22 HISTORY — DX: Other intervertebral disc degeneration, lumbar region without mention of lumbar back pain or lower extremity pain: M51.369

## 2023-08-22 HISTORY — DX: Sick sinus syndrome: I49.5

## 2023-08-22 HISTORY — DX: Other chondrocalcinosis, unspecified site: M11.20

## 2023-08-22 HISTORY — DX: Paroxysmal atrial fibrillation: I48.0

## 2023-08-22 HISTORY — DX: Cervicalgia: M54.2

## 2023-08-22 SURGERY — RELEASE, CARPAL TUNNEL, ENDOSCOPIC
Anesthesia: General | Site: Wrist | Laterality: Right

## 2023-08-22 MED ORDER — PROPOFOL 10 MG/ML IV BOLUS
INTRAVENOUS | Status: AC
Start: 2023-08-22 — End: ?
  Filled 2023-08-22: qty 20

## 2023-08-22 MED ORDER — DEXAMETHASONE SODIUM PHOSPHATE 10 MG/ML IJ SOLN
INTRAMUSCULAR | Status: DC | PRN
  Administered 2023-08-22: 10 mg via INTRAVENOUS

## 2023-08-22 MED ORDER — FENTANYL CITRATE (PF) 100 MCG/2ML IJ SOLN
INTRAMUSCULAR | Status: AC
  Filled 2023-08-22: qty 2

## 2023-08-22 MED ORDER — TRAMADOL HCL 50 MG PO TABS
50.0000 mg | ORAL_TABLET | Freq: Four times a day (QID) | ORAL | Status: DC | PRN
  Administered 2023-08-22: 50 mg via ORAL

## 2023-08-22 MED ORDER — FENTANYL CITRATE (PF) 100 MCG/2ML IJ SOLN
INTRAMUSCULAR | Status: DC | PRN
Start: 2023-08-22 — End: 2023-08-22
  Administered 2023-08-22: 25 ug via INTRAVENOUS
  Administered 2023-08-22: 50 ug via INTRAVENOUS
  Administered 2023-08-22: 25 ug via INTRAVENOUS

## 2023-08-22 MED ORDER — BUPIVACAINE HCL (PF) 0.5 % IJ SOLN
INTRAMUSCULAR | Status: DC | PRN
  Administered 2023-08-22: 10 mL

## 2023-08-22 MED ORDER — METOCLOPRAMIDE HCL 5 MG/ML IJ SOLN
5.0000 mg | Freq: Three times a day (TID) | INTRAMUSCULAR | Status: DC | PRN
Start: 2023-08-22 — End: 2023-08-22

## 2023-08-22 MED ORDER — PROPOFOL 10 MG/ML IV BOLUS
INTRAVENOUS | Status: DC | PRN
  Administered 2023-08-22: 150 mg via INTRAVENOUS

## 2023-08-22 MED ORDER — ONDANSETRON HCL 4 MG PO TABS: 4.0000 mg | ORAL_TABLET | Freq: Four times a day (QID) | ORAL | Status: DC | PRN

## 2023-08-22 MED ORDER — BUPIVACAINE HCL (PF) 0.5 % IJ SOLN
INTRAMUSCULAR | Status: AC
Start: 2023-08-22 — End: ?
  Filled 2023-08-22: qty 30

## 2023-08-22 MED ORDER — ONDANSETRON HCL 4 MG/2ML IJ SOLN
INTRAMUSCULAR | Status: DC | PRN
  Administered 2023-08-22: 4 mg via INTRAVENOUS

## 2023-08-22 MED ORDER — FENTANYL CITRATE (PF) 100 MCG/2ML IJ SOLN: 25.0000 ug | INTRAMUSCULAR | Status: DC | PRN

## 2023-08-22 MED ORDER — OXYCODONE HCL 5 MG/5ML PO SOLN: 5.0000 mg | Freq: Once | ORAL | Status: DC | PRN

## 2023-08-22 MED ORDER — ACETAMINOPHEN 325 MG PO TABS: 325.0000 mg | ORAL_TABLET | Freq: Four times a day (QID) | ORAL | Status: DC | PRN

## 2023-08-22 MED ORDER — OXYCODONE HCL 5 MG PO TABS: 5.0000 mg | ORAL_TABLET | Freq: Once | ORAL | Status: DC | PRN

## 2023-08-22 MED ORDER — MIDAZOLAM HCL 2 MG/2ML IJ SOLN
INTRAMUSCULAR | Status: AC
Start: 2023-08-22 — End: ?
  Filled 2023-08-22: qty 2

## 2023-08-22 MED ORDER — CHLORHEXIDINE GLUCONATE 0.12 % MT SOLN
OROMUCOSAL | Status: AC
  Filled 2023-08-22: qty 15

## 2023-08-22 MED ORDER — METOCLOPRAMIDE HCL 10 MG PO TABS: 5.0000 mg | ORAL_TABLET | Freq: Three times a day (TID) | ORAL | Status: DC | PRN

## 2023-08-22 MED ORDER — LIDOCAINE HCL (CARDIAC) PF 100 MG/5ML IV SOSY
PREFILLED_SYRINGE | INTRAVENOUS | Status: DC | PRN
  Administered 2023-08-22: 80 mg via INTRAVENOUS

## 2023-08-22 MED ORDER — MIDAZOLAM HCL 2 MG/2ML IJ SOLN
INTRAMUSCULAR | Status: DC | PRN
  Administered 2023-08-22: 1 mg via INTRAVENOUS

## 2023-08-22 MED ORDER — ONDANSETRON HCL 4 MG/2ML IJ SOLN: 4.0000 mg | Freq: Four times a day (QID) | INTRAMUSCULAR | Status: DC | PRN

## 2023-08-22 MED ORDER — CEFAZOLIN SODIUM-DEXTROSE 2-4 GM/100ML-% IV SOLN
INTRAVENOUS | Status: AC
  Filled 2023-08-22: qty 100

## 2023-08-22 MED ORDER — TRAMADOL HCL 50 MG PO TABS: 50.0000 mg | ORAL_TABLET | Freq: Four times a day (QID) | ORAL | 0 refills | Status: DC | PRN

## 2023-08-22 MED ORDER — 0.9 % SODIUM CHLORIDE (POUR BTL) OPTIME
TOPICAL | Status: DC | PRN
  Administered 2023-08-22: 500 mL

## 2023-08-22 MED ORDER — TRAMADOL HCL 50 MG PO TABS
ORAL_TABLET | ORAL | Status: AC
  Filled 2023-08-22: qty 1

## 2023-08-22 SURGICAL SUPPLY — 29 items
BNDG COHESIVE 4X5 TAN STRL LF (GAUZE/BANDAGES/DRESSINGS) ×1 IMPLANT
BNDG ELASTIC 2INX 5YD STR LF (GAUZE/BANDAGES/DRESSINGS) ×1 IMPLANT
BNDG ESMARCH 4X12 STRL LF (GAUZE/BANDAGES/DRESSINGS) ×1 IMPLANT
CHLORAPREP W/TINT 26 (MISCELLANEOUS) ×1 IMPLANT
CORD BIP STRL DISP 12FT (MISCELLANEOUS) ×1 IMPLANT
CUFF TOURN SGL QUICK 18X4 (TOURNIQUET CUFF) ×1 IMPLANT
DRAPE SURG 17X11 SM STRL (DRAPES) ×1 IMPLANT
FORCEPS JEWEL BIP 4-3/4 STR (INSTRUMENTS) ×1 IMPLANT
GAUZE SPONGE 4X4 12PLY STRL (GAUZE/BANDAGES/DRESSINGS) ×1 IMPLANT
GAUZE XEROFORM 1X8 LF (GAUZE/BANDAGES/DRESSINGS) ×1 IMPLANT
GLOVE BIO SURGEON STRL SZ8 (GLOVE) ×1 IMPLANT
GLOVE INDICATOR 8.0 STRL GRN (GLOVE) ×1 IMPLANT
GOWN STRL REUS W/ TWL LRG LVL3 (GOWN DISPOSABLE) ×1 IMPLANT
GOWN STRL REUS W/ TWL XL LVL3 (GOWN DISPOSABLE) ×1 IMPLANT
KIT ESCP INSRT D SLOT CANN KN (MISCELLANEOUS) ×1 IMPLANT
KIT TURNOVER KIT A (KITS) ×1 IMPLANT
MANIFOLD NEPTUNE II (INSTRUMENTS) ×1 IMPLANT
NS IRRIG 500ML POUR BTL (IV SOLUTION) ×1 IMPLANT
PACK EXTREMITY ARMC (MISCELLANEOUS) ×1 IMPLANT
SPLINT WRIST LG LT TX990309 (SOFTGOODS) IMPLANT
SPLINT WRIST LG RT TX900304 (SOFTGOODS) IMPLANT
SPLINT WRIST M LT TX990308 (SOFTGOODS) IMPLANT
SPLINT WRIST M RT TX990303 (SOFTGOODS) IMPLANT
SPLINT WRIST XL LT TX990310 (SOFTGOODS) IMPLANT
SPLINT WRIST XL RT TX990305 (SOFTGOODS) IMPLANT
STOCKINETTE IMPERVIOUS 9X36 MD (GAUZE/BANDAGES/DRESSINGS) ×1 IMPLANT
SUT PROLENE 4 0 PS 2 18 (SUTURE) ×1 IMPLANT
TRAP FLUID SMOKE EVACUATOR (MISCELLANEOUS) ×1 IMPLANT
WATER STERILE IRR 500ML POUR (IV SOLUTION) ×1 IMPLANT

## 2023-08-22 NOTE — Anesthesia Procedure Notes (Signed)
Procedure Name: LMA Insertion Date/Time: 08/22/2023 1:51 PM  Performed by: Lanell Matar, CRNAPre-anesthesia Checklist: Patient identified, Emergency Drugs available, Suction available and Patient being monitored Patient Re-evaluated:Patient Re-evaluated prior to induction Oxygen Delivery Method: Circle System Utilized Preoxygenation: Pre-oxygenation with 100% oxygen Induction Type: IV induction Ventilation: Mask ventilation without difficulty LMA: LMA inserted LMA Size: 5.0 Number of attempts: 1 Placement Confirmation: positive ETCO2 Tube secured with: Tape Dental Injury: Teeth and Oropharynx as per pre-operative assessment

## 2023-08-22 NOTE — Op Note (Signed)
08/22/2023  2:26 PM  Patient:   [name]  Pre-Op Diagnosis:   Right carpal tunnel syndrome.  Post-Op Diagnosis:   Same.  Procedure:   Endoscopic right carpal tunnel release.  Surgeon:   Maryagnes Amos, MD  Anesthesia:   General LMA  Findings:   As above.  Complications:   None  EBL:   0 cc  Fluids:   300 cc crystalloid  TT:   14 minutes at 250 mmHg  Drains:   None  Closure:   4-0 Prolene interrupted sutures  Brief Clinical Note:   The patient is a 72 year old male with a history of progressively worsening pain and paresthesias to his right hand.  His symptoms have progressed despite medications, activity modification, etc.  This history and examination are consistent with carpal tunnel syndrome.  The patient presents at this time for an endoscopic right carpal tunnel release.   Procedure:   The patient was brought into the operating room and lain in the supine position. After adequate general laryngeal mask anesthesia was obtained, the right hand and upper extremity were prepped with ChloraPrep solution before being draped sterilely. Preoperative antibiotics were administered. A timeout was performed to verify the appropriate surgical site before the limb was exsanguinated with an Esmarch and the tourniquet inflated to 250 mmHg.   An approximately 1.5-2 cm incision was made over the volar wrist flexion crease, centered over the palmaris longus tendon. The incision was carried down through the subcutaneous tissues with care taken to identify and protect any neurovascular structures. The distal forearm fascia was penetrated just proximal to the transverse carpal ligament. The soft tissues were released off the superficial and deep surfaces of the distal forearm fascia and this was released proximally for 3-4 cm under direct visualization.  Attention was directed distally. The Therapist, nutritional was passed beneath the transverse carpal ligament along the ulnar aspect of the  carpal tunnel and used to release any adhesions as well as to remove any adherent synovial tissue before first the smaller then the larger of the two dilators were passed beneath the transverse carpal ligament along the ulnar margin of the carpal tunnel. The slotted cannula was introduced and the endoscope was placed into the slotted cannula and the undersurface of the transverse carpal ligament visualized. The distal margin of the transverse carpal ligament was marked by placing a 25-gauge needle percutaneously at Kaplan's cardinal point so that it entered the distal portion of the slotted cannula. Under endoscopic visualization, the transverse carpal ligament was released from proximal to distal using the end-cutting blade. A second pass was performed to ensure complete release of the ligament. The adequacy of release was verified both endoscopically and by palpation using the freer elevator.  The wound was irrigated thoroughly with sterile saline solution before being closed using 4-0 Prolene interrupted sutures. A total of 10 cc of 0.5% plain Sensorcaine was injected in and around the incision before a sterile bulky dressing was applied to the wound. The patient was placed into a volar wrist splint before being awakened, extubated, and returned to the recovery room in satisfactory condition after tolerating the procedure well.

## 2023-08-22 NOTE — Anesthesia Postprocedure Evaluation (Signed)
Anesthesia Post Note  Patient: [name]  Procedure(s) Performed: CARPAL TUNNEL RELEASE ENDOSCOPIC (Right: Wrist)  Patient location during evaluation: PACU Anesthesia Type: General Level of consciousness: awake and alert Pain management: pain level controlled Vital Signs Assessment: post-procedure vital signs reviewed and stable Respiratory status: spontaneous breathing, nonlabored ventilation, respiratory function stable and patient connected to nasal cannula oxygen Cardiovascular status: blood pressure returned to baseline and stable Postop Assessment: no apparent nausea or vomiting Anesthetic complications: no  No notable events documented.   Last Vitals:  Vitals:   08/22/23 1457 08/22/23 1507  BP:  139/89  Pulse: 62 73  Resp: 18 18  Temp: (!) 36.4 C   SpO2: 98% 99%    Last Pain:  Vitals:   08/22/23 1507  TempSrc: Temporal  PainSc: 2                  Stephanie Coup

## 2023-08-22 NOTE — Discharge Instructions (Addendum)
Orthopedic discharge instructions: Keep dressing dry and intact. Keep hand elevated above heart level. May shower after dressing removed on postop day 4 (Sunday). Cover sutures with Band-Aids after drying off, then reapply Velcro splint. Apply ice to affected area frequently. Take Celebrex 200 mg daily with meals for 3-5 days, then as necessary. Take ES Tylenol or pain medication as prescribed when needed.  May resume Eliquis tomorrow. Return for follow-up in 10-14 days or as scheduled.

## 2023-08-22 NOTE — H&P (Signed)
History of Present Illness:  [name] is a 72 y.o. male who presents for evaluation and treatment of his right wrist pain and paresthesias extending to the thumb, index, and long fingers. The patient notes that the symptoms have been present for 2 years and developed without any specific cause or injury. The patient is status post a prior mini open left carpal tunnel release performed by Dr. Rosita Kea in August, 2020, from which he has done quite well. Apparently, the patient saw Dr. Rosita Kea in February, 2023 for this right wrist symptoms. At the time, his symptoms were quite manageable and he did not wish to consider more aggressive treatment options. Over the past year or so, his symptoms have worsened considerably. He did see Dr. Landry Mellow and July, 2024 who gave him a steroid injection which she states provided temporary partial relief, lasting only a couple of weeks before his symptoms recurred. The patient notes that the symptoms are worse with any repetitive activities. He is awakened each morning with pain in his hand and has difficulty performing any repetitive activities such as washing dishes. He also has difficulty even lifting something as heavy as a cup of coffee. In addition to the steroid injection, he has been taking Celebrex and Tylenol with limited benefit. In addition, he has tried a course of oral steroids and been using a splint at night, all with limited benefit.  Current Outpatient Medications: acetaminophen (TYLENOL) 650 MG ER tablet Take 1 tablet (650 mg total) by mouth every 8 (eight) hours as needed for Pain  amLODIPine (NORVASC) 2.5 MG tablet take 1 tablet by mouth every day 90 tablet 3  amoxicillin (AMOXIL) 500 MG capsule TAKE 4 CAPSULES BY MOUTH 1 HR PRIOR TO DENTAL TREATMENT  ASCORBATE CALCIUM (VITAMIN C ORAL) Take 1 tablet by mouth once daily.  atorvastatin (LIPITOR) 10 MG tablet TAKE 1 TABLET BY MOUTH EVERY DAY 90 tablet 3  CALCIUM CARBONATE (CALCIUM 600 ORAL) Take 1 tablet  by mouth once daily  celecoxib (CELEBREX) 200 MG capsule TAKE 1 CAPSULE (200 MG TOTAL) BY MOUTH ONCE DAILY 90 capsule 3  doxazosin (CARDURA) 8 MG tablet TAKE 1 TABLET (8 MG TOTAL) BY MOUTH ONCE DAILY 87 tablet 4  ELIQUIS 5 mg tablet TAKE 1 TABLET BY MOUTH TWICE A DAY 180 tablet 3  ERGOCALCIFEROL, VITAMIN D2, (VITAMIN D3 ORAL) Take 1 capsule by mouth once daily.  FUROsemide (LASIX) 40 MG tablet TAKE 1 TABLET (40 MG TOTAL) BY MOUTH ONCE DAILY AS NEEDED. 90 tablet 3  gabapentin (NEURONTIN) 100 MG capsule TAKE 100 mg in the morning 90 capsule 3  gabapentin (NEURONTIN) 400 MG capsule Take 400mg  nightly 90 capsule 3  hydroxychloroquine (PLAQUENIL) 200 mg tablet Take 1 tablet (200 mg total) by mouth 2 (two) times daily 180 tablet 1  losartan-hydroCHLOROthiazide (HYZAAR) 100-25 mg tablet take 1 tablet by mouth every day 90 tablet 1  MULTIVITAMIN ORAL Take 1 tablet by mouth once daily.  omega-3 fatty acids-fish oil 360-1,200 mg Cap Take by mouth Take 1,200 mg by mouth 2 (two) times a day.  pantoprazole (PROTONIX) 40 MG DR tablet Take 1 tablet (40 mg total) by mouth 2 (two) times daily before meals 180 tablet 3   Allergies:   Antihistamine [Diphenhydramine-Tripelen-Menth] Other (HALLUCINATIONS; DEPRESSION)  Antihistamines - Alkylamine Unknown  Sleep loss, hallucinations  Band-Aid Plus Antibiotic [Bacitracin Zinc-Polymyxin B] Other (Burns the skin causing blisters).  Loratadine Other (Depression with all antihistamines)  Tegaderm Ag Mesh [Silver] Other (Burns the skin causing blisters)  Adhesive Other (Blistering - Tegaderm)   Past Medical History:  BPH (benign prostatic hypertrophy)  Cardiac pacemaker 02/26/2017  dual-chamber pacemaker implantation on 06/28/2016 for SSS  Cardiomyopathy, secondary (CMS/HHS-HCC)  Diverticulosis 05/05/2010  ED (erectile dysfunction)  GERD (gastroesophageal reflux disease)  Heart palpitations 04/24/2016  HTN (hypertension) 12/25/2013  Hypertension  Impaired glucose  tolerance 08/18/2015  Neck pain  Obesity  Paroxysmal atrial fibrillation (CMS/HHS-HCC) 02/26/2017  Premature ventricular contractions 04/24/2016  Restless leg syndrome 08/18/2015  Seasonal allergies  Sleep apnea  Tubular adenoma of colon 05/25/2006   Past Surgical History:  COLONOSCOPY 05/2006 (at which time polyp was resected, tubular adenomatous)  KNEE ARTHROSCOPY 07/03/2006 (Right knee meniscal repair, arthroscopic)  COLONOSCOPY 05/05/2010  CHOLECYSTECTOMY 05/2010 (laparoscopic)  Right total knee replacement 09/28/2010 (Dr. Ernest Pine)  COLONOSCOPY 06/24/2015 (Diverticulosis/Otherwise normal/PHx of polyps/FHx of Colon polyps-Father/Repeat 29yrs/PYO)  Left total knee arthroplasty 10/25/2015 (Dr. Ernest Pine)  INSERT / REPLACE / REMOVE PACEMAKER 06/28/2016  CARDIAC SURGERY 06/28/2016  [facility]  Limited arthroscopic debridement, arthroscopic subacromial decompression, mini-open rotator cuff repair, and mini-open biceps tenodesis, right shoulder. Right 12/18/2017 (Dr. Joice Lofts)  Right Carpal Tunnel Release Left 04/24/2019 (Dr. Joice Lofts)  COLONOSCOPY 03/15/2021 (Tubular adenomas/PHx CP/Repeat 37yrs/CTL)  Excision of lipoma  KNEE ARTHROSCOPY (Left knee meniscal repair, arthroscopic)  PROSTATE SURGERY (prostate reduction)  VASECTOMY   Family History:  Arthritis Mother Jearl Klinefelter  Atrial fibrillation (Abnormal heart rhythm sometimes requiring treatment with blood thinners) Mother Jearl Klinefelter  Stroke Father Charles Kaluzny  High blood pressure (Hypertension) Father Oliver Barre  Colon cancer Father Charles Evola  Diabetes Father Charles Morado  Coronary Artery Disease (Blocked arteries around heart) Father Charles Durkee  Hyperlipidemia (Elevated cholesterol) Father Magazine features editor  Colon polyps Brother  Arthritis Brother Air traffic controller  High blood pressure (Hypertension) Brother Onalee Hua Poss  Asthma Maternal Grandfather Gurney Maxin   Social History:   Socioeconomic History:  Marital status:  Married  Spouse name: Bonita Quin  Number of children: 2  Years of education: 18  Occupational History  Occupation: Retired  Tobacco Use  Smoking status: Former  Current packs/day: 0.00  Average packs/day: 1 pack/day for 5.0 years (5.0 ttl pk-yrs)  Types: Cigarettes  Start date: 04/18/1969  Quit date: 02/16/1974  Years since quitting: 49.4  Smokeless tobacco: Never  Tobacco comments:  light smoker  Vaping Use  Vaping status: Never Used  Substance and Sexual Activity  Alcohol use: Never  Alcohol/week: 0.0 standard drinks of alcohol  Drug use: No  Sexual activity: Yes  Partners: Female   Social Drivers of Health:   Physicist, medical Strain: Low Risk (04/03/2023)  Overall Financial Resource Strain (CARDIA)  Difficulty of Paying Living Expenses: Not hard at all  Food Insecurity: No Food Insecurity (04/03/2023)  Hunger Vital Sign  Worried About Running Out of Food in the Last Year: Never true  Ran Out of Food in the Last Year: Never true  Transportation Needs: No Transportation Needs (04/03/2023)  PRAPARE - Risk analyst (Medical): No  Lack of Transportation (Non-Medical): No   Review of Systems:  A comprehensive 14 point ROS was performed, reviewed, and the pertinent orthopaedic findings are documented in the HPI.  Physical Exam: Vitals:  08/01/23 0855  BP: (!) 144/82  Weight: (!) 128.9 kg (284 lb 3.2 oz)  Height: 180.3 cm (5\' 11" )  PainSc: 3  PainLoc: Wrist   General/Constitutional: The patient appears to be well-nourished, well-developed, and in no acute distress. Neuro/Psych: Normal mood and affect, oriented to person, place and time. Eyes: Non-icteric. Pupils  are equal, round, and reactive to light, and exhibit synchronous movement. ENT: Unremarkable. Lymphatic: No palpable adenopathy. Respiratory: Lungs clear to auscultation, Normal chest excursion, No wheezes, and Non-labored breathing Cardiovascular: Irregularly irregular rate and rhythm, but  no murmurs and No edema, swelling or tenderness, except as noted in detailed exam. Integumentary: No impressive skin lesions present, except as noted in detailed exam. Musculoskeletal: Unremarkable, except as noted in detailed exam.  Right wrist/hand exam: Skin inspection of the right wrist and hand is unremarkable. No swelling, erythema, ecchymosis, abrasions, or other skin abnormalities are identified. He has no tenderness to palpation over the dorsal or volar aspects of the right wrist, noted to have any tenderness to palpation over the dorsal or palmar aspects of the right hand. He exhibits near full active and passive range of motion of the wrist without pain. He is able to actively flex and extend all digits fully without any pain or triggering. He is neurovascularly intact to all digits of his right hand, other than some subjectively decreased sensation to light touch to the tips of the thumb, index, and long fingers. He has a positive Phalen's test, but a negative Tinel's over the carpal tunnel.  Assessment: 1. Right carpal tunnel syndrome.   Plan: The treatment options were discussed with the patient. In addition, patient educational materials were provided regarding the diagnosis and treatment options. The patient is quite frustrated by his symptoms and functional limitations, and is ready to consider more aggressive treatment options. Therefore, I have recommended a surgical procedure, specifically an endoscopic right carpal tunnel release. The procedure was discussed with the patient, as were the potential risks (including bleeding, infection, nerve and/or blood vessel injury, persistent or recurrent pain/paresthesias, weakness of grip, need for further surgery, blood clots, strokes, heart attacks and/or arhythmias, pneumonia, etc.) and benefits. The patient states his understanding and wishes to proceed. All of the patient's questions and concerns were answered. He can call any time with  further concerns. He will follow up post-surgery, routine.    H&P reviewed and patient re-examined. No changes.

## 2023-08-22 NOTE — Anesthesia Preprocedure Evaluation (Signed)
Anesthesia Evaluation  Patient identified by MRN, date of birth, ID band Patient awake    Reviewed: Allergy & Precautions, NPO status , Patient's Chart, lab work & pertinent test results  History of Anesthesia Complications (+) history of anesthetic complications (failed spinal)  Airway Mallampati: II       Dental  (+) Dental Advidsory Given   Pulmonary neg shortness of breath, sleep apnea and Continuous Positive Airway Pressure Ventilation , neg COPD, neg recent URI, former smoker          Cardiovascular hypertension, Pt. on medications (-) angina (-) Past MI + dysrhythmias Atrial Fibrillation + pacemaker (-) Valvular Problems/Murmurs Rhythm:irregular Rate:Abnormal     Neuro/Psych neg Seizures    GI/Hepatic Neg liver ROS,GERD  Medicated and Controlled,,  Endo/Other  neg diabetes    Renal/GU      Musculoskeletal   Abdominal   Peds  Hematology   Anesthesia Other Findings Past Medical History: No date: Adenomatous polyp of colon No date: Anginal pain (HCC) No date: BPH (benign prostatic hyperplasia) No date: Cardiomyopathy, secondary (HCC) No date: Complication of anesthesia     Comment:  spinal anesthesia did not work for right TKR ended up               with general No date: Diverticulosis No date: Dysrhythmia No date: Erectile dysfunction No date: GERD (gastroesophageal reflux disease) No date: Hypertension No date: Lumbosacral radiculitis No date: Obesity No date: Osteoarthritis     Comment:  first metacarpal of left hand No date: Presence of permanent cardiac pacemaker No date: RLS (restless legs syndrome) No date: Sleep apnea No date: SOB (shortness of breath)   Reproductive/Obstetrics                             Anesthesia Physical Anesthesia Plan  ASA: 3  Anesthesia Plan: General   Post-op Pain Management:    Induction: Intravenous  PONV Risk Score and Plan: 2  and Ondansetron and Dexamethasone  Airway Management Planned: LMA  Additional Equipment:   Intra-op Plan:   Post-operative Plan: Extubation in OR  Informed Consent: I have reviewed the patients History and Physical, chart, labs and discussed the procedure including the risks, benefits and alternatives for the proposed anesthesia with the patient or authorized representative who has indicated his/her understanding and acceptance.     Dental Advisory Given  Plan Discussed with: Anesthesiologist, CRNA and Surgeon  Anesthesia Plan Comments: (Patient consented for risks of anesthesia including but not limited to:  - adverse reactions to medications - damage to eyes, teeth, lips or other oral mucosa - nerve damage due to positioning  - sore throat or hoarseness - Damage to heart, brain, nerves, lungs, other parts of body or loss of life  Patient voiced understanding and assent.)       Anesthesia Quick Evaluation

## 2023-08-22 NOTE — Transfer of Care (Signed)
Immediate Anesthesia Transfer of Care Note  Patient: [name]  Procedure(s) Performed: CARPAL TUNNEL RELEASE ENDOSCOPIC (Right: Wrist)  Patient Location: PACU  Anesthesia Type:General  Level of Consciousness: awake, drowsy, and patient cooperative  Airway & Oxygen Therapy: Patient Spontanous Breathing  Post-op Assessment: Report given to RN, Post -op Vital signs reviewed and stable, and Patient moving all extremities X 4  Post vital signs: Reviewed and stable  Last Vitals:  Vitals Value Taken Time  BP 131/82 08/22/23 1430  Temp    Pulse 65 08/22/23 1431  Resp 9 08/22/23 1431  SpO2 93 % 08/22/23 1431  Vitals shown include unfiled device data.  Last Pain:  Vitals:   08/22/23 1306  TempSrc: Temporal  PainSc: 2          Complications: No notable events documented.

## 2023-08-23 ENCOUNTER — Encounter: Payer: Self-pay | Admitting: Surgery

## 2023-08-29 ENCOUNTER — Other Ambulatory Visit: Payer: Self-pay

## 2023-08-29 ENCOUNTER — Inpatient Hospital Stay
Admission: RE | Admit: 2023-08-29 | Discharge: 2023-08-29 | Disposition: A | Discharge status: Home / Self Care | Payer: Self-pay | Source: Ambulatory Visit | Attending: Neurosurgery | Admitting: Neurosurgery

## 2023-08-29 DIAGNOSIS — Z049 Encounter for examination and observation for unspecified reason: Secondary | ICD-10-CM

## 2023-09-03 NOTE — Progress Notes (Unsigned)
Referring Physician:  Bridgette Habermann, PA-C [address] [place],  [place] [address]  Primary Physician:  Marina Goodell, MD  History of Present Illness: 09/05/2023 [name] is here today with a chief complaint of chronic back pain and neck pain.  He states that he gets intermittent radiation into his shoulders sometimes down the arms but this is very uncommon.  He also has significant amount of back pain which has been present for many years and he has been treated conservatively.  He feels like this is going on for at least the past 20 or 30 years.  States it is approximately 2-3 out of 10 at the current point.  He has not had any recent physical therapy.  He has tried anti-inflammatories gabapentin as well as other conservative care.  He has not tried any of the alternative cares f including physical or occupational therapy.  He has not tried any traction.  Review of Systems:  A 10 point review of systems is negative, except for the pertinent positives and negatives detailed in the HPI.  Past Medical History: Past Medical History:  Diagnosis Date   Adenomatous polyp of colon    Anginal pain (HCC)    Bilateral carpal tunnel syndrome    BPH (benign prostatic hyperplasia)    Cardiomyopathy, secondary (HCC)    Cervicalgia    Cholelithiasis    Complication of anesthesia    a.) ineffective neuraxial anesthesia for RIGHT TKR; converted to GA course   DDD (degenerative disc disease), lumbar    Diverticulosis    Erectile dysfunction    GERD (gastroesophageal reflux disease)    History of echocardiogram 05/10/2016   a.) TTE 05/10/2016: EF >55%, mild LVH, mild LAE, normal RVSF, triv AR, mild MR/TR   Hypertension    Long term current use of immunosuppressive drug    a.) hydroxychloroquine for pseudogout   Lumbosacral radiculitis    Obesity    On apixaban therapy    OSA on CPAP    Osteoarthritis    PAF (paroxysmal atrial fibrillation) (HCC)    a.) CHA2DS2-VASc =  2 (age, HTN) as of 08/15/2023; b.) cardiac rate/rhythm maintained intrinsically without pharmacological intervention; chronically anticoagulated using apixaban   Palpitations (PAC/PVCs)    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/2016   a.) Medtronic [id number] dual-chamber PPM (MRI compatiable)   Pseudogout    Renal cyst, right    RLS (restless legs syndrome)    Sinus bradycardia    SOB (shortness of breath)    SSS (sick sinus syndrome) (HCC)    a.) s/p dual chamber PPM placement 06/28/2016    Past Surgical History: Past Surgical History:  Procedure Laterality Date   CARPAL TUNNEL RELEASE Left 04/24/2019   Procedure: CARPAL TUNNEL RELEASE;  Surgeon: Kennedy Bucker, MD;  Location: [facility] ORS;  Service: Orthopedics;  Laterality: Left;   CARPAL TUNNEL RELEASE Right 08/22/2023   Procedure: CARPAL TUNNEL RELEASE ENDOSCOPIC;  Surgeon: Christena Flake, MD;  Location: [facility];  Service: Orthopedics;  Laterality: Right;   CHOLECYSTECTOMY     COLONOSCOPY WITH PROPOFOL N/A 06/24/2015   Procedure: COLONOSCOPY WITH PROPOFOL;  Surgeon: Wallace Cullens, MD;  Location: [facility];  Service: Gastroenterology;  Laterality: N/A;   COLONOSCOPY WITH PROPOFOL N/A 03/15/2021   Procedure: COLONOSCOPY WITH PROPOFOL;  Surgeon: Regis Bill, MD;  Location: [facility] ENDOSCOPY;  Service: Endoscopy;  Laterality: N/A;  ELIQUIS - WILL NEED MEDICAL CLEARANCE   KNEE ARTHROPLASTY Left 10/25/2015   Procedure:  COMPUTER ASSISTED TOTAL KNEE ARTHROPLASTY;  Surgeon: Donato Heinz, MD;  Location: [facility] OR[facility];  Service: Orthopedics;  Laterality: Left;   KNEE ARTHROSCOPY Bilateral    LIPOMA EXCISION N/A    upper/mid aspect of back   PACEMAKER INSERTION N/A 06/28/2016   Procedure: INSERTION PACEMAKER;  Surgeon: Marcina Millard, MD;  Location: [facility] OR[facility];  Service: Cardiovascular;  Laterality: N/A;  left   PHOTOSELECTIVE VAPORIZATION OF THE PROSTATE (GREENLIGHT LASER) N/A    x 2   SHOULDER ARTHROSCOPY WITH OPEN ROTATOR CUFF  REPAIR Right 12/18/2017   Procedure: SHOULDER ARTHROSCOPY WITH OPEN ROTATOR CUFF REPAIR;  Surgeon: Christena Flake, MD;  Location: [facility] OR[facility];  Service: Orthopedics;  Laterality: Right;   TOTAL KNEE ARTHROPLASTY Right 09/28/2010   VASECTOMY      Allergies: Allergies as of 09/05/2023 - Review Complete 09/05/2023  Allergen Reaction Noted   Antihistamines, diphenhydramine-type  06/23/2015   Antihistamines, loratadine-type  03/14/2021   Band-aid plus antibiotic [bacitracin-polymyxin b]  03/14/2021   Tegaderm ag mesh [silver]  03/14/2021   Adhesive [tape] Other (See Comments) 10/07/2015    Medications:  Current Outpatient Medications:    acetaminophen (TYLENOL) 500 MG tablet, Take 1,000 mg by mouth in the morning., Disp: , Rfl:    apixaban (ELIQUIS) 5 MG TABS tablet, Take 5 mg by mouth 2 (two) times daily., Disp: , Rfl:    calcium carbonate (OSCAL) 1500 (600 Ca) MG TABS tablet, Take 600 mg of elemental calcium by mouth daily., Disp: , Rfl:    celecoxib (CELEBREX) 200 MG capsule, Take 200 mg by mouth daily., Disp: , Rfl:    Cholecalciferol (VITAMIN D) 2000 units tablet, Take 2,000 Units by mouth daily., Disp: , Rfl:    doxazosin (CARDURA) 8 MG tablet, Take 8 mg by mouth daily., Disp: , Rfl:    fluticasone (FLONASE) 50 MCG/ACT nasal spray, Place 2 sprays into both nostrils in the morning and at bedtime., Disp: , Rfl:    furosemide (LASIX) 40 MG tablet, Take 40 mg by mouth daily as needed for fluid. , Disp: , Rfl:    gabapentin (NEURONTIN) 100 MG capsule, Take 100 mg by mouth in the morning., Disp: , Rfl:    gabapentin (NEURONTIN) 400 MG capsule, Take 400 mg by mouth at bedtime., Disp: , Rfl:    hydrocortisone cream 1 %, Apply 1 application topically daily as needed for itching., Disp: , Rfl:    hydroxychloroquine (PLAQUENIL) 200 MG tablet, Take 200 mg by mouth 2 (two) times daily., Disp: , Rfl:    losartan-hydrochlorothiazide (HYZAAR) 100-25 MG tablet, Take 1 tablet by mouth daily., Disp: , Rfl:     Multiple Vitamins-Minerals ([employer] SPECTRAVITE PO), Take 1 tablet by mouth daily., Disp: , Rfl:    NON FORMULARY, Pt uses a cpap nightly, Disp: , Rfl:    Omega-3 Fatty Acids (FISH OIL) 1200 MG CAPS, Take 1,200 mg by mouth 2 (two) times a day. , Disp: , Rfl:    pantoprazole (PROTONIX) 40 MG tablet, Take 40 mg by mouth 2 (two) times daily., Disp: , Rfl:    vitamin C (ASCORBIC ACID) 500 MG tablet, Take 500 mg by mouth daily., Disp: , Rfl:    amLODipine (NORVASC) 2.5 MG tablet, Take 2.5 mg by mouth daily., Disp: , Rfl:   Social History: Social History   Tobacco Use   Smoking status: Former    Current packs/day: 0.00    Average packs/day: 1 pack/day for 5.0 years (5.0 ttl pk-yrs)    Types: Cigarettes  Start date: 02/16/1969    Quit date: 02/16/1974    Years since quitting: 49.5   Smokeless tobacco: Never   Tobacco comments:    quit 47 years ago  Vaping Use   Vaping status: Never Used  Substance Use Topics   Alcohol use: No   Drug use: No    Family Medical History: Family History  Problem Relation Age of Onset   COPD Mother    Diabetes Father    Stroke Father    Heart disease Father     Physical Examination: Vitals:   09/05/23 0958  BP: 132/82    General: Patient is in no apparent distress. Attention to examination is appropriate.  Neck:   Supple.  Full range of motion.  Respiratory: Patient is breathing without any difficulty.   NEUROLOGICAL:     Awake, alert, oriented to person, place, and time.  Speech is clear and fluent.   Cranial Nerves: Pupils equal round and reactive to light.  Facial tone is symmetric.  Facial sensation is symmetric. Shoulder shrug is symmetric. Tongue protrusion is midline.    Strength: He has no gross deficits noted on physical examination.  Reflexes are hyporeflexic throughout but symmetric, no evidence of Hoffmann's, no evidence of clonus  Nondermatomal sensation changes in the bilateral feet, mostly in the bottom of the feet     No  evidence of dysmetria noted.  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  Cervical spine shows loss of cervical lordosis as well with mostly foraminal changes, no severe evidence of central disease or T2 signal change.  I have personally reviewed the images and agree with the above interpretation.  Medical Decision Making/Assessment and Plan: Mr. Frager is a pleasant 72 y.o. male with history of neck and back pain for approximately last 25 to 30 years.  Overall he states that he continues to have back and neck pain but is still quite hesitant to undergo any surgical treatment.  He has not participated in any recent physical therapy.  We do feel that therapy would help with his posture core strength and likely his back and neck pain.  If this continues to worsen we will plan for evaluation for possible injections.  Given the spondylosis noted in the loss of cervical lordosis and lumbar lordosis noted on his MRI would like to get flexion-extension films of his cervical spine as well as his lumbar spine.  Would like to follow-up with him in the future.  At this point not planning any emergent surgical intervention.  Thank you for involving me in the care of this patient.   We spent a total of 40 minutes discussing his history, reviewing his images, performing a physical examination and evaluation, and going through his imaging answering questions and coordinating his care plan.  Lovenia Kim MD/MSCR Neurosurgery

## 2023-09-05 ENCOUNTER — Ambulatory Visit
Admission: RE | Admit: 2023-09-05 | Discharge: 2023-09-05 | Disposition: A | Discharge status: Home / Self Care | Payer: Medicare Other | Attending: Neurosurgery | Admitting: Neurosurgery

## 2023-09-05 ENCOUNTER — Ambulatory Visit
Admission: RE | Admit: 2023-09-05 | Discharge: 2023-09-05 | Disposition: A | Discharge status: Home / Self Care | Payer: Medicare Other | Source: Ambulatory Visit | Attending: Neurosurgery | Admitting: Neurosurgery

## 2023-09-05 ENCOUNTER — Ambulatory Visit (INDEPENDENT_AMBULATORY_CARE_PROVIDER_SITE_OTHER): Payer: Medicare Other | Admitting: Neurosurgery

## 2023-09-05 VITALS — BP 132/82 | Ht 71.0 in | Wt 281.0 lb

## 2023-09-05 DIAGNOSIS — M4056 Lordosis, unspecified, lumbar region: Secondary | ICD-10-CM

## 2023-09-05 DIAGNOSIS — M4712 Other spondylosis with myelopathy, cervical region: Secondary | ICD-10-CM | POA: Insufficient documentation

## 2023-09-05 DIAGNOSIS — M48061 Spinal stenosis, lumbar region without neurogenic claudication: Secondary | ICD-10-CM

## 2023-09-05 DIAGNOSIS — M545 Low back pain, unspecified: Secondary | ICD-10-CM | POA: Diagnosis present

## 2023-09-05 DIAGNOSIS — G8929 Other chronic pain: Secondary | ICD-10-CM | POA: Diagnosis present

## 2023-09-05 NOTE — Patient Instructions (Signed)
LOCAL PHYSICAL THERAPY  [facility]  [address]  [place], [place] [address]  [phone]  [facility]  [address] [place], [place] [address]  [phone]  [facility] (2 locations)  [address]  [place], [place] [address]  [phone]          or  [address]  [place], [place] [address]  [phone]  [facility]  [address]  [place], [place] [address]  [phone]  **dry needling**  [facility]  [address]  [place], [place] [address]  [phone]  Fax: [phone]  ** Aquatic therapy[address] [place], [place] [address] [phone] **Aquatic therapy**  [facility]  [facility]  [address]  [place], [place] [address]  [phone]  [facility]  [address]  [place], [place] [address]  [phone]  [facility]  [address]   Janora Norlander  [place], [place] [address]  [phone]  [facility]  [address]  [place], [place] [address]  [phone]  **dry needling**   PELVIC FLOOR/SI JOINT  [facility]  Mariane Masters, PT  [email or web address]   [place]  [facility]  [address]  [address] [place], [place] [address]  [phone]   [facility] [place]  [address], [place] [address]  [phone]   [facility], [place]  [facility]  Raymond Gurney, PT  [address]  [place], [place] [address] [phone]   Samara Deist  [facility]  [address]  [phone]   [facility]  [address]  [phone]   [facility]  Annamaria Boots [facility]  [address]  [place], [place] [address]  [phone]   **No Aetna or medicaid**  [facility]  [phone] [address], [place] [address]  [phone]  [facility]  [address]  [place], [place] [address]  [phone]  Dannial Monarch, DC  [address], [place] [address]  [phone]  [facility]  [address], [place] [address]  [phone]  [facility]  [address]  [place], [place] [address]  [phone]  [facility]  (3 locations)  [address]  [place], [place] [address]  [phone]  **dry needling**           or  [address] [place], [place] [address]  [phone]  **Additionally has Gloris Manchester, OT**           or  [address]  [place], [place] [address]  [phone]  **Pediatric therapy**  [facility]  [address]  [phone]  **dry needlingVerdie Drown [facility]  [address]  [place], [place] [address]  [phone]  [facility]   (Inside [address] [facility])  [address]  [place], [place] [address]  [phone]  **dry needling**  **MEDICAID or UNINSURED** [facility] dept. Of Physical Therapy [place], [place] [address] [phone]  [facility]  [address] [place], [place] [address]  [phone]   [facility]  [address]  [place], [place] [address]  [phone]   Doreatha Martin  [facility]  [address] [place], [place] [address]  [phone]   [facility]  [address]  [place], [place] [address]  [phone]   [facility]  [address] [place], [place] [address]  [phone]  [facility]  [address]  [place], [place] [address]  [phone]  AQUATIC  Kathalene Frames [facility]  [employer] [facility]  [facility]  [place]  [place]  *Residents only*   [facility] [employer]  [facility]  Exercise class  [facility]  Exercise class  [facility]  [address]  [place], [place]   [phone]  [facility]  [address]  [place], [place] [address]  [phone]   [place], [place]  [facility]  [address]  [phone]   [facility]  [facility]  [address]  [phone]           or  [address]  [phone]   [facility]   Provides education and support and practical information for coping with arthritis for arthritis sufferers and their families.   When: 12:15 - 1:30 p.m. the second Monday of each month, March through December  Info: Call Rehabilitation Services at [phone]

## 2023-10-01 ENCOUNTER — Ambulatory Visit (INDEPENDENT_AMBULATORY_CARE_PROVIDER_SITE_OTHER): Payer: Medicare Other | Admitting: Neurosurgery

## 2023-10-01 DIAGNOSIS — M4312 Spondylolisthesis, cervical region: Secondary | ICD-10-CM | POA: Diagnosis not present

## 2023-10-01 NOTE — Progress Notes (Signed)
 I do follow Phone call with Mr. Od[place] today.  He was at home and I was in the office.  He gave consent to go forward and discuss his cervical spine disease.  As an update he has been having improved symptom management as he has been working hard with physical therapy overall he is feeling somewhat better but does have on and off days.  I called him to discuss the changes in his cervical spine that we noticed on his flexion-extension x-rays.  The full report will be below.   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  This appears to move with flexion and extension and can often cause positional and motion related neck pain.  It is reassuring however that he is having improvement in his symptom control with physical therapy.  Given these findings would like to continue to follow-up with him throughout his conservative management.   Will plan on seeing him as previously scheduled.  All the risks and benefits and red flags were discussed  We spent approximately 15 minutes reviewing his chart, going over the results with over the phone and explaining their implications.  Penne MICAEL Sharps, MD

## 2023-12-03 ENCOUNTER — Other Ambulatory Visit: Payer: Self-pay | Admitting: Surgery

## 2023-12-05 ENCOUNTER — Ambulatory Visit (INDEPENDENT_AMBULATORY_CARE_PROVIDER_SITE_OTHER): Payer: Medicare Other | Admitting: Neurosurgery

## 2023-12-05 ENCOUNTER — Other Ambulatory Visit: Payer: Self-pay | Admitting: Surgery

## 2023-12-05 VITALS — BP 132/84 | Ht 71.0 in | Wt 284.0 lb

## 2023-12-05 DIAGNOSIS — M4712 Other spondylosis with myelopathy, cervical region: Secondary | ICD-10-CM

## 2023-12-05 DIAGNOSIS — M4312 Spondylolisthesis, cervical region: Secondary | ICD-10-CM | POA: Diagnosis not present

## 2023-12-05 NOTE — Progress Notes (Signed)
 Referring Physician:  Marina Goodell, MD [address] [place],  [place] [address]  Primary Physician:  Marina Goodell, MD  History of Present Illness: 12/05/2023 [name] is here today on cervicalgia and cervical radiculopathy that was treated conservatively.  We treated him with physical therapy.  He is here today for follow-up and states that he is done so much better and is feeling positive about his trajectory.  He is going for a orthopedic procedure soon for a shoulder issue.  Review of Systems:  A 10 point review of systems is negative, except for the pertinent positives and negatives detailed in the HPI.  Past Medical History: Past Medical History:  Diagnosis Date   Adenomatous polyp of colon    Anginal pain (HCC)    Bilateral carpal tunnel syndrome    BPH (benign prostatic hyperplasia)    Cardiomyopathy, secondary (HCC)    Cervicalgia    Cholelithiasis    Complication of anesthesia    a.) ineffective neuraxial anesthesia for RIGHT TKR; converted to GA course   DDD (degenerative disc disease), lumbar    Diverticulosis    Erectile dysfunction    GERD (gastroesophageal reflux disease)    History of echocardiogram 05/10/2016   a.) TTE 05/10/2016: EF >55%, mild LVH, mild LAE, normal RVSF, triv AR, mild MR/TR   Hypertension    Long term current use of immunosuppressive drug    a.) hydroxychloroquine for pseudogout   Lumbosacral radiculitis    Obesity    On apixaban therapy    OSA on CPAP    Osteoarthritis    PAF (paroxysmal atrial fibrillation) (HCC)    a.) CHA2DS2-VASc = 2 (age, HTN) as of 08/15/2023; b.) cardiac rate/rhythm maintained intrinsically without pharmacological intervention; chronically anticoagulated using apixaban   Palpitations (PAC/PVCs)    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/2016   a.) Medtronic [id number] dual-chamber PPM (MRI compatiable)   Pseudogout    Renal cyst, right    RLS (restless legs syndrome)    Sinus  bradycardia    SOB (shortness of breath)    SSS (sick sinus syndrome) (HCC)    a.) s/p dual chamber PPM placement 06/28/2016    Past Surgical History: Past Surgical History:  Procedure Laterality Date   CARPAL TUNNEL RELEASE Left 04/24/2019   Procedure: CARPAL TUNNEL RELEASE;  Surgeon: Kennedy Bucker, MD;  Location: [facility];  Service: Orthopedics;  Laterality: Left;   CARPAL TUNNEL RELEASE Right 08/22/2023   Procedure: CARPAL TUNNEL RELEASE ENDOSCOPIC;  Surgeon: Christena Flake, MD;  Location: [facility];  Service: Orthopedics;  Laterality: Right;   CHOLECYSTECTOMY     COLONOSCOPY WITH PROPOFOL N/A 06/24/2015   Procedure: COLONOSCOPY WITH PROPOFOL;  Surgeon: [place] Cullens, MD;  [facility];  Service: Gastroenterology;  Laterality: N/A;   COLONOSCOPY WITH PROPOFOL N/A 03/15/2021   Procedure: COLONOSCOPY WITH PROPOFOL;  Surgeon: Regis Bill, MD;  Location: [facility] ENDOSCOPY;  Service: Endoscopy;  Laterality: N/A;  ELIQUIS - WILL NEED MEDICAL CLEARANCE   KNEE ARTHROPLASTY Left 10/25/2015   Procedure: COMPUTER ASSISTED TOTAL KNEE ARTHROPLASTY;  Surgeon: Donato Heinz, MD;  Location: [facility];  Service: Orthopedics;  Laterality: Left;   KNEE ARTHROSCOPY Bilateral    LIPOMA EXCISION N/A    upper/mid aspect of back   PACEMAKER INSERTION N/A 06/28/2016   Procedure: INSERTION PACEMAKER;  Surgeon: Marcina Millard, MD;  Location: [facility];  Service: Cardiovascular;  Laterality: N/A;  left   PHOTOSELECTIVE VAPORIZATION OF THE PROSTATE (GREENLIGHT  LASER) N/A    x 2   SHOULDER ARTHROSCOPY WITH OPEN ROTATOR CUFF REPAIR Right 12/18/2017   Procedure: SHOULDER ARTHROSCOPY WITH OPEN ROTATOR CUFF REPAIR;  Surgeon: Christena Flake, MD;  Location: [facility];  Service: Orthopedics;  Laterality: Right;   TOTAL KNEE ARTHROPLASTY Right 09/28/2010   VASECTOMY      Allergies: Allergies as of 12/05/2023 - Review Complete 12/05/2023  Allergen Reaction Noted   Antihistamines, diphenhydramine-type   06/23/2015   Antihistamines, loratadine-type  03/14/2021   Band-aid plus antibiotic [bacitracin-polymyxin b]  03/14/2021   Tegaderm ag mesh [silver]  03/14/2021    Medications:  Current Outpatient Medications:    acetaminophen (TYLENOL) 500 MG tablet, Take 1,000 mg by mouth in the morning., Disp: , Rfl:    amLODipine (NORVASC) 2.5 MG tablet, Take 2.5 mg by mouth daily., Disp: , Rfl:    apixaban (ELIQUIS) 5 MG TABS tablet, Take 5 mg by mouth 2 (two) times daily., Disp: , Rfl:    atorvastatin (LIPITOR) 10 MG tablet, Take 10 mg by mouth daily., Disp: , Rfl:    calcium carbonate (OSCAL) 1500 (600 Ca) MG TABS tablet, Take 600 mg of elemental calcium by mouth daily., Disp: , Rfl:    celecoxib (CELEBREX) 200 MG capsule, Take 200 mg by mouth daily., Disp: , Rfl:    Cholecalciferol (VITAMIN D) 2000 units tablet, Take 2,000 Units by mouth daily., Disp: , Rfl:    doxazosin (CARDURA) 8 MG tablet, Take 8 mg by mouth daily., Disp: , Rfl:    fluticasone (FLONASE) 50 MCG/ACT nasal spray, Place 2 sprays into both nostrils in the morning and at bedtime., Disp: , Rfl:    furosemide (LASIX) 40 MG tablet, Take 40 mg by mouth daily as needed for fluid. , Disp: , Rfl:    gabapentin (NEURONTIN) 100 MG capsule, Take 100 mg by mouth in the morning., Disp: , Rfl:    gabapentin (NEURONTIN) 400 MG capsule, Take 400 mg by mouth at bedtime., Disp: , Rfl:    hydrocortisone cream 1 %, Apply 1 application topically daily as needed for itching., Disp: , Rfl:    hydroxychloroquine (PLAQUENIL) 200 MG tablet, Take 200 mg by mouth 2 (two) times daily., Disp: , Rfl:    losartan-hydrochlorothiazide (HYZAAR) 100-25 MG tablet, Take 1 tablet by mouth daily., Disp: , Rfl:    Multiple Vitamins-Minerals ([employer] SPECTRAVITE PO), Take 1 tablet by mouth daily., Disp: , Rfl:    NON FORMULARY, Pt uses a cpap nightly, Disp: , Rfl:    Omega-3 Fatty Acids (FISH OIL) 1200 MG CAPS, Take 1,200 mg by mouth 2 (two) times a day. , Disp: , Rfl:     pantoprazole (PROTONIX) 40 MG tablet, Take 40 mg by mouth 2 (two) times daily., Disp: , Rfl:    vitamin C (ASCORBIC ACID) 500 MG tablet, Take 500 mg by mouth daily., Disp: , Rfl:   Social History: Social History   Tobacco Use   Smoking status: Former    Current packs/day: 0.00    Average packs/day: 1 pack/day for 5.0 years (5.0 ttl pk-yrs)    Types: Cigarettes    Start date: 02/16/1969    Quit date: 02/16/1974    Years since quitting: 49.8   Smokeless tobacco: Never   Tobacco comments:    quit 47 years ago  Vaping Use   Vaping status: Never Used  Substance Use Topics   Alcohol use: No   Drug use: No    Family Medical History: Family History  Problem Relation Age of Onset   COPD Mother    Diabetes Father    Stroke Father    Heart disease Father     Physical Examination: Vitals:   12/05/23 0951  BP: 132/84    General: Patient is in no apparent distress. Attention to examination is appropriate.  Neck:   Supple.  Full range of motion.  Respiratory: Patient is breathing without any difficulty.   NEUROLOGICAL:     Awake, alert, oriented to person, place, and time.  Speech is clear and fluent.   Cranial Nerves: Pupils equal round and reactive to light.  Facial tone is symmetric.  Facial sensation is symmetric. Shoulder shrug is symmetric. Tongue protrusion is midline.    Strength: He has no gross deficits noted on physical examination.  Reflexes are hyporeflexic throughout but symmetric, no evidence of Hoffmann's, no evidence of clonus  Nondermatomal sensation changes in the bilateral feet, mostly in the bottom of the feet     No evidence of dysmetria noted.  Gait is antalgic  Imaging: No imaging performed today  Medical Decision Making/Assessment and Plan: Mr. Tardif is a pleasant 73 y.o. male with history of neck and back pain for approximately last 25 to 30 years.  When I saw him last he was having an exacerbation of neck pain and radiculopathy.  He at that  time he had not had any physical therapy but however has started physical therapy and feels much better.  He continues to work on his other issues overall and is being evaluated by orthopedics for shoulder operation.  He does have a slight spondylolisthesis that is slightly mobile on his x-rays so would consider limiting some of his extension during intubation.  I let him know to discuss this with his orthopedic surgeon at his appointment today.  Thank you for involving me in the care of this patient.   Spent a total of 30 minutes reviewing his chart, going over his old imaging, face-to-face evaluation, coordination of his care, reaching out to his surgical team, and answering his questions.   Lovenia Kim MD/MSCR Neurosurgery

## 2023-12-06 ENCOUNTER — Encounter: Payer: Self-pay | Admitting: Surgery

## 2023-12-06 ENCOUNTER — Other Ambulatory Visit: Payer: Self-pay

## 2023-12-06 ENCOUNTER — Encounter
Admission: RE | Admit: 2023-12-06 | Discharge: 2023-12-06 | Disposition: A | Discharge status: Home / Self Care | Source: Ambulatory Visit | Attending: Surgery | Admitting: Surgery

## 2023-12-06 VITALS — BP 124/69 | HR 70 | Resp 14 | Ht 71.0 in | Wt 285.0 lb

## 2023-12-06 DIAGNOSIS — Z01812 Encounter for preprocedural laboratory examination: Secondary | ICD-10-CM | POA: Diagnosis not present

## 2023-12-06 DIAGNOSIS — Z01818 Encounter for other preprocedural examination: Secondary | ICD-10-CM

## 2023-12-06 LAB — URINALYSIS, ROUTINE W REFLEX MICROSCOPIC
Bilirubin Urine: NEGATIVE
Glucose, UA: NEGATIVE mg/dL
Hgb urine dipstick: NEGATIVE
Ketones, ur: NEGATIVE mg/dL
Leukocytes,Ua: NEGATIVE
Nitrite: NEGATIVE
Protein, ur: NEGATIVE mg/dL
Specific Gravity, Urine: 1.004 — ABNORMAL LOW (ref 1.005–1.030)
pH: 7 (ref 5.0–8.0)

## 2023-12-06 LAB — COMPREHENSIVE METABOLIC PANEL
ALT: 31 U/L (ref 0–44)
AST: 34 U/L (ref 15–41)
Albumin: 4.4 g/dL (ref 3.5–5.0)
Alkaline Phosphatase: 92 U/L (ref 38–126)
Anion gap: 11 (ref 5–15)
BUN: 14 mg/dL (ref 8–23)
CO2: 25 mmol/L (ref 22–32)
Calcium: 9.7 mg/dL (ref 8.9–10.3)
Chloride: 101 mmol/L (ref 98–111)
Creatinine, Ser: 0.85 mg/dL (ref 0.61–1.24)
GFR, Estimated: >60 mL/min (ref >60)
Glucose, Bld: 75 mg/dL (ref 70–99)
Potassium: 3.6 mmol/L (ref 3.5–5.1)
Sodium: 137 mmol/L (ref 135–145)
Total Bilirubin: 1.1 mg/dL (ref 0.0–1.2)
Total Protein: 6.9 g/dL (ref 6.5–8.1)

## 2023-12-06 LAB — CBC WITH DIFFERENTIAL/PLATELET
Abs Immature Granulocytes: 0.02 10*3/uL (ref 0.00–0.07)
Basophils Absolute: 0 10*3/uL (ref 0.0–0.1)
Basophils Relative: 0 %
Eosinophils Absolute: 0.2 10*3/uL (ref 0.0–0.5)
Eosinophils Relative: 3 %
HCT: 39.9 % (ref 39.0–52.0)
Hemoglobin: 14.1 g/dL (ref 13.0–17.0)
Immature Granulocytes: 0 %
Lymphocytes Relative: 13 %
Lymphs Abs: 0.7 10*3/uL (ref 0.7–4.0)
MCH: 30.6 pg (ref 26.0–34.0)
MCHC: 35.3 g/dL (ref 30.0–36.0)
MCV: 86.6 fL (ref 80.0–100.0)
Monocytes Absolute: 0.5 10*3/uL (ref 0.1–1.0)
Monocytes Relative: 9 %
Neutro Abs: 4.1 10*3/uL (ref 1.7–7.7)
Neutrophils Relative %: 75 %
Platelets: 181 10*3/uL (ref 150–400)
RBC: 4.61 MIL/uL (ref 4.22–5.81)
RDW: 12.5 % (ref 11.5–15.5)
WBC: 5.4 10*3/uL (ref 4.0–10.5)
nRBC: 0 % (ref 0.0–0.2)

## 2023-12-06 LAB — SURGICAL PCR SCREEN
MRSA, PCR: NEGATIVE
Staphylococcus aureus: NEGATIVE

## 2023-12-06 NOTE — Patient Instructions (Addendum)
 Your procedure is scheduled on: Tuesday 12/18/23 To find out your arrival time, please call [phone] between 1PM - 3PM on: Monday 12/17/23   Report to the Registration Desk on the 1st floor of the [facility]. Free Valet parking is available.  If your arrival time is 6:00 am, do not arrive before that time as the [facility] entrance doors do not open until 6:00 am.  REMEMBER: Instructions that are not followed completely may result in serious medical risk, up to and including death; or upon the discretion of your surgeon and anesthesiologist your surgery may need to be rescheduled.  Do not eat food after midnight the night before surgery.  No gum chewing or hard candies.  You may however, drink CLEAR liquids up to 2 hours before you are scheduled to arrive for your surgery. Do not drink anything within 2 hours of your scheduled arrival time.  Clear liquids include: - water  - apple juice without pulp - gatorade (not RED colors) - black coffee or tea (Do NOT add milk or creamers to the coffee or tea) Do NOT drink anything that is not on this list.  Type 1 and Type 2 diabetics should only drink water.  In addition, your doctor has ordered for you to drink the provided:  Ensure Pre-Surgery Clear Carbohydrate Drink  Drinking this carbohydrate drink up to two hours before surgery helps to reduce insulin resistance and improve patient outcomes. Please complete drinking 2 hours before scheduled arrival time.  One week prior to surgery: Stop Anti-inflammatories (NSAIDS) such as Advil, Aleve, Ibuprofen, Motrin, Naproxen, Naprosyn and Aspirin based products such as Excedrin, Goody's Powder, BC Powder. You may however, continue to take Tylenol if needed for pain up until the day of surgery.  Stop ANY OVER THE COUNTER supplements and vitamins for 7 days until after surgery.  Continue taking all prescribed medications with the exception of the following: Eliquis, hold for surgery 3 days  with last dose on Friday 12/14/23. Please contact Dr Darrold Junker Follow recommendations from Cardiologist or PCP regarding stopping blood thinners.  TAKE ONLY THESE MEDICATIONS THE MORNING OF SURGERY WITH A SIP OF WATER:  Amlodipine Gabapentin Pantoprazole You can use your Flonase  No Alcohol for 24 hours before or after surgery.  No Smoking including e-cigarettes for 24 hours before surgery.  No chewable tobacco products for at least 6 hours before surgery.  No nicotine patches on the day of surgery.  Do not use any "recreational" drugs for at least a week (preferably 2 weeks) before your surgery.  Please be advised that the combination of cocaine and anesthesia may have negative outcomes, up to and including death. If you test positive for cocaine, your surgery will be cancelled.  On the morning of surgery brush your teeth with toothpaste and water, you may rinse your mouth with mouthwash if you wish. Do not swallow any toothpaste or mouthwash.  Use CHG Soap or wipes as directed on instruction sheet.  Do not wear lotions, powders, or perfumes. No Deodorant  Do not shave body hair from the neck down 48 hours before surgery.  Wear comfortable clothing (specific to your surgery type) to the hospital.  Do not wear jewelry, make-up, hairpins, clips or nail polish.  For welded (permanent) jewelry: bracelets, anklets, waist bands, etc.  Please have this removed prior to surgery.  If it is not removed, there is a chance that hospital personnel will need to cut it off on the day of surgery. Contact  lenses, hearing aids and dentures may not be worn into surgery.  Bring your C-PAP (may leave in car) to the hospital in case you may have to spend the night.   Do not bring valuables to the hospital. [facility] is not responsible for any missing/lost belongings or valuables.   Total Shoulder Arthroplasty:  use Benzoyl Peroxide 5% Gel as directed on instruction sheet.  Notify your doctor if  there is any change in your medical condition (cold, fever, infection).  If you are being discharged the day of surgery, you will not be allowed to drive home. You will need a responsible individual to drive you home and stay with you for 24 hours after surgery.   If you are taking public transportation, you will need to have a responsible individual with you.  If you are being admitted to the hospital overnight, leave your suitcase in the car. After surgery it may be brought to your room.  In case of increased patient census, it may be necessary for you, the patient, to continue your postoperative care in the Same Day Surgery department.  After surgery, you can help prevent lung complications by doing breathing exercises.  Take deep breaths and cough every 1-2 hours. Your doctor may order a device called an Incentive Spirometer to help you take deep breaths.  Surgery Visitation Policy:  Patients undergoing a surgery or procedure may have two family members or support persons with them as long as the person is not COVID-19 positive or experiencing its symptoms.   Inpatient Visitation:    Visiting hours are 7 a.m. to 8 p.m. Up to four visitors are allowed at one time in a patient room. The visitors may rotate out with other people during the day. One designated support person (adult) may remain overnight.  Due to an increase in RSV and influenza rates and associated hospitalizations, children ages 70 and under will not be able to visit patients in [facility]. Masks continue to be strongly recommended.  Please call the Pre-admissions Testing Dept. at [phone] if you have any questions about these instructions.    Pre-operative 5 CHG Bath Instructions   You can play a key role in reducing the risk of infection after surgery. Your skin needs to be as free of germs as possible. You can reduce the number of germs on your skin by washing with CHG (chlorhexidine gluconate)  soap before surgery. CHG is an antiseptic soap that kills germs and continues to kill germs even after washing.   DO NOT use if you have an allergy to chlorhexidine/CHG or antibacterial soaps. If your skin becomes reddened or irritated, stop using the CHG and notify one of our RNs at [phone].   Please shower with the CHG soap starting 4 days before surgery using the following schedule:   Friday 12/14/23 - Tuesday 12/18/23    Please keep in mind the following:  DO NOT shave, including legs and underarms, starting the day of your first shower.   You may shave your face at any point before/day of surgery.  Place clean sheets on your bed the day you start using CHG soap. Use a clean washcloth (not used since being washed) for each shower. DO NOT sleep with pets once you start using the CHG.   CHG Shower Instructions:  If you choose to wash your hair and private area, wash first with your normal shampoo/soap.  After you use shampoo/soap, rinse your hair and body thoroughly to remove  shampoo/soap residue.  Turn the water OFF and apply about 3 tablespoons (45 ml) of CHG soap to a CLEAN washcloth.  Apply CHG soap ONLY FROM YOUR NECK DOWN TO YOUR TOES (washing for 3-5 minutes)  DO NOT use CHG soap on face, private areas, open wounds, or sores.  Pay special attention to the area where your surgery is being performed.  If you are having back surgery, having someone wash your back for you may be helpful. Wait 2 minutes after CHG soap is applied, then you may rinse off the CHG soap.  Pat dry with a clean towel  Put on clean clothes/pajamas   If you choose to wear lotion, please use ONLY the CHG-compatible lotions on the back of this paper.     Additional instructions for the day of surgery: DO NOT APPLY any lotions, deodorants, cologne, or perfumes.   Put on clean/comfortable clothes.  Brush your teeth.  Ask your nurse before applying any prescription medications to the skin.      CHG  Compatible Lotions   Aveeno Moisturizing lotion  Cetaphil Moisturizing Cream  Cetaphil Moisturizing Lotion  Clairol Herbal Essence Moisturizing Lotion, Dry Skin  Clairol Herbal Essence Moisturizing Lotion, Extra Dry Skin  Clairol Herbal Essence Moisturizing Lotion, Normal Skin  Curel Age Defying Therapeutic Moisturizing Lotion with Alpha Hydroxy  Curel Extreme Care Body Lotion  Curel Soothing Hands Moisturizing Hand Lotion  Curel Therapeutic Moisturizing Cream, Fragrance-Free  Curel Therapeutic Moisturizing Lotion, Fragrance-Free  Curel Therapeutic Moisturizing Lotion, Original Formula  Eucerin Daily Replenishing Lotion  Eucerin Dry Skin Therapy Plus Alpha Hydroxy Crme  Eucerin Dry Skin Therapy Plus Alpha Hydroxy Lotion  Eucerin Original Crme  Eucerin Original Lotion  Eucerin Plus Crme Eucerin Plus Lotion  Eucerin TriLipid Replenishing Lotion  Keri Anti-Bacterial Hand Lotion  Keri Deep Conditioning Original Lotion Dry Skin Formula Softly Scented  Keri Deep Conditioning Original Lotion, Fragrance Free Sensitive Skin Formula  Keri Lotion Fast Absorbing Fragrance Free Sensitive Skin Formula  Keri Lotion Fast Absorbing Softly Scented Dry Skin Formula  Keri Original Lotion  Keri Skin Renewal Lotion Keri Silky Smooth Lotion  Keri Silky Smooth Sensitive Skin Lotion  Nivea Body Creamy Conditioning Oil  Nivea Body Extra Enriched Lotion  Nivea Body Original Lotion  Nivea Body Sheer Moisturizing Lotion Nivea Crme  Nivea Skin Firming Lotion  NutraDerm 30 Skin Lotion  NutraDerm Skin Lotion  NutraDerm Therapeutic Skin Cream  NutraDerm Therapeutic Skin Lotion  ProShield Protective Hand Cream  Provon moisturizing lotion  Preparing for Total Shoulder Arthroplasty  Before surgery, you can play an important role by reducing the number of germs on your skin by using the following products:  Benzoyl Peroxide Gel  o Reduces the number of germs present on the skin  o Applied twice a day  to shoulder area starting two days before surgery  Chlorhexidine Gluconate (CHG) Soap  o An antiseptic cleaner that kills germs and bonds with the skin to continue killing germs even after washing  o Used for showering the night before surgery and morning of surgery  BENZOYL PEROXIDE 5% GEL  Please do not use if you have an allergy to benzoyl peroxide. If your skin becomes reddened/irritated stop using the benzoyl peroxide.  Starting two days before surgery, apply as follows:  1. Apply benzoyl peroxide in the morning and at night. Apply after taking a shower. If you are not taking a shower, clean entire shoulder front, back, and side along with the armpit with a clean wet  washcloth.  2. Place a quarter-sized dollop on your shoulder and rub in thoroughly, making sure to cover the front, back, and side of your shoulder, along with the armpit.  2 days before (Sunday 12/16/23) ____ AM ____ PM  1 day before (Monday 12/17/23) ____ AM ____ PM  3. Do this twice a day for two days. (Last application is the night before surgery, AFTER using the CHG soap).  4. Do NOT apply benzoyl peroxide gel on the day of surgery.

## 2023-12-11 ENCOUNTER — Encounter: Payer: Self-pay | Admitting: Surgery

## 2023-12-11 NOTE — Progress Notes (Signed)
 Perioperative / Anesthesia Services  Pre-Admission Testing Clinical Review / Pre-Operative Anesthesia Consult  Date: 12/11/23  Patient Demographics:  Name: [name] DOB:   [birth year] MRN:   [id number]  Planned Surgical Procedure(s):    Case: [id number] Date/Time: 12/18/23 0730   Procedure: ARTHROPLASTY, SHOULDER, TOTAL, REVERSE (Left: Shoulder)   Anesthesia type: Choice   Diagnosis: Nontraumatic complete tear of left rotator cuff [M75.122]   Pre-op diagnosis: Nontraumatic complete tear of left rotator cuff   Location: [facility] OR ROOM 02 / [facility]   Surgeons: Christena Flake, MD     NOTE: Available PAT nursing documentation and vital signs have been reviewed. Clinical nursing staff has updated patient's PMH/PSHx, current medication list, and drug allergies/intolerances to ensure comprehensive history available to assist in medical decision making as it pertains to the aforementioned surgical procedure and anticipated anesthetic course. Extensive review of available clinical information personally performed. [facility] and PSHx updated with any diagnoses/procedures that  may have been inadvertently omitted during his intake with the pre-admission testing department's nursing staff.  Clinical Discussion:  [name] is a 73 y.o. male who is submitted for pre-surgical anesthesia review and clearance prior to him undergoing the above procedure. Patient is a Former Smoker (5 pack years; quit 02/1974). Pertinent PMH includes: SSS (s/p PPM placement), PAF, secondary cardiomyopathy, palpitations, angina, HTN, SOB, OSAH (requires nocturnal PAP therapy), OA, LEFT rotator cuff, teat, BILATERAL carpal tunnel syndrome, cervicalgia, lumbar DDD.  Patient is followed by cardiology Darrold Junker, MD). He was last seen in the cardiology clinic on 07/03/2023; notes reviewed. At the time of his clinic visit, patient doing well overall from a cardiovascular perspective. Patient  denied any chest pain, shortness of breath, PND, orthopnea, palpitations, significant peripheral edema, weakness, fatigue, vertiginous symptoms, or presyncope/syncope. Patient with a past medical history significant for cardiovascular diagnoses. Documented physical exam was grossly benign, providing no evidence of acute exacerbation and/or decompensation of the patient's known cardiovascular conditions.  TTE was performed on 05/10/2016 revealing a normal left ventricular systolic function with an EF of >55%.  There were no regional wall motion abnormalities.  There was mild LVH.  Left atrium mildly enlarged.  There was trivial to mild aortic, mitral, and tricuspid valve regurgitation.  RVSP = 34.0 mmHg. All transvalvular gradients were noted to be normal providing no evidence suggestive of valvular stenosis. Aorta normal in size with appreciable ectasia or aneurysmal dilatation.   Patient with a history of sick sinus syndrome that ultimately required placement of a permanent pacemaker.  Patient underwent placement of a Medtronic [id number] dual-chamber pacemaker on 06/28/2016.  Device is regularly interrogated by patient's primary cardiology/electrophysiology team.  Last interrogation was on 07/24/2023 at which time device was noted to be functioning properly.  Patient with an atrial fibrillation diagnosis; CHA2DS2-VASc Score = 2 (age, HTN).currently, cardiac rate and rhythm are being maintained intrinsically without pharmacological intervention.  Patient is chronically anticoagulated using standard dose DOAC (apixaban). Patient is reported be compliant with therapy with no evidence reports of GI bleeding.  Blood pressure well-controlled at 124/72 mmHg on currently prescribed CCB (amlodipine), alpha-blocker (doxazosin), diuretic (furosemide + HCTZ) and ARB (losartan) therapies.  Patient does not take any type of prescribed lipid-lowering therapies for ASCVD prevention, however he does take an over-the-counter  omega-3 fatty acid capsule. Patient does have an OSAH diagnosis and is reported to be compliant with prescribed nocturnal PAP therapy.  Patient making an effort to maintain an active lifestyle; walks daily.  He  is able to complete all of his ADLs/IADLs independently without cardiovascular limitation.  Per the DASI, patient is able to exceed 4 METS of physical activity without experiencing any significant angina/anginal equivalent symptoms.  No changes were made to his medication regimen.  Patient to follow-up with outpatient cardiology in 6 months or sooner if needed.  [name] is scheduled for an elective ARTHROPLASTY, SHOULDER, TOTAL, REVERSE (Left: Shoulder) on 12/18/2023 with Dr. Leron Croak, MD.  Given patient's past medical history significant for cardiovascular diagnoses, presurgical cardiac clearance was sought by the PAT team. Per cardiology, "this patient is optimized for surgery and may proceed with the planned procedural course with a LOW risk of significant perioperative cardiovascular complications".  Again, this patient is on daily oral anticoagulation therapy using a DOAC medication. He has been instructed on recommendations for holding his apixaban for 3 days prior to his procedure with plans to restart as soon as postoperative bleeding risk felt to be minimized by his attending surgeon. The patient has been instructed that his last dose of his apixaban should be on 12/14/2022.  Patient reports previous perioperative complications with anesthesia in the past. Patient reporting (+) ineffective neuraxial anesthetic course with prior knee arthroplasty. Ultimately case was converted to general anesthesia. In review of the available records, it is noted that patient underwent a general anesthetic course here at [facility] ([facility]) in 08/2023 without documented complications.      12/06/2023    9:06 AM 12/05/2023    9:51 AM 09/05/2023    9:58 AM  Vitals  with BMI  Height 5\' 11"  5\' 11"  5\' 11"   Weight 285 lbs 284 lbs 281 lbs  BMI 39.77 39.63 39.21  Systolic 124 132 161  Diastolic 69 84 82  Pulse 70      Providers/Specialists:   NOTE: Primary physician provider listed below. Patient may have been seen by APP or partner within same practice.   PROVIDER ROLE / SPECIALTY LAST OV  Poggi, Excell Seltzer, MD Orthopedics (Surgeon) 12/05/2023  Marina Goodell, MD Primary Care Provider 10/12/2023  Marcina Millard, MD Cardiology  06/23/2023  Theora Master, MD Neurology 07/25/2023  Gerrie Nordmann, MD Rheumatology 07/03/2023   Allergies:  Antihistamines, diphenhydramine-type; Antihistamines, loratadine-type; Tegaderm ag mesh [silver]; and Wound dressing adhesive  Current Home Medications:   No current facility-administered medications for this encounter.    amLODipine (NORVASC) 2.5 MG tablet   apixaban (ELIQUIS) 5 MG TABS tablet   atorvastatin (LIPITOR) 10 MG tablet   celecoxib (CELEBREX) 200 MG capsule   doxazosin (CARDURA) 8 MG tablet   fluticasone (FLONASE) 50 MCG/ACT nasal spray   furosemide (LASIX) 40 MG tablet   gabapentin (NEURONTIN) 100 MG capsule   gabapentin (NEURONTIN) 400 MG capsule   hydrocortisone cream 1 %   hydroxychloroquine (PLAQUENIL) 200 MG tablet   losartan-hydrochlorothiazide (HYZAAR) 100-25 MG tablet   Multiple Vitamins-Minerals ([employer] SPECTRAVITE PO)   Omega-3 Fatty Acids (FISH OIL) 1200 MG CAPS   pantoprazole (PROTONIX) 40 MG tablet   vitamin C (ASCORBIC ACID) 500 MG tablet   acetaminophen (TYLENOL) 650 MG CR tablet   Calcium Carbonate-Vit D-Min (CALCIUM 1200 PO)   Cholecalciferol (VITAMIN D-3) 25 MCG (1000 UT) CAPS   NON FORMULARY   History:   Past Medical History:  Diagnosis Date   Adenomatous polyp of colon    Anginal pain (HCC)    Bilateral carpal tunnel syndrome    BPH (benign prostatic hyperplasia)    Cardiomyopathy, secondary (HCC)  Cervicalgia    Cholelithiasis    Complication of anesthesia     a.) ineffective neuraxial anesthesia for RIGHT TKR; converted to GA course   DDD (degenerative disc disease), lumbar    Diverticulosis    Erectile dysfunction    GERD (gastroesophageal reflux disease)    History of echocardiogram 05/10/2016   a.) TTE 05/10/2016: EF >55%, mild LVH, mild LAE, normal RVSF, triv AR, mild MR/TR   Hypertension    Long term current use of immunosuppressive drug    a.) hydroxychloroquine for pseudogout   Lumbosacral radiculitis    Obesity    On apixaban therapy    OSA on CPAP    Osteoarthritis    PAF (paroxysmal atrial fibrillation) (HCC)    a.) CHA2DS2-VASc = 2 (age, HTN) as of 08/15/2023; b.) cardiac rate/rhythm maintained intrinsically without pharmacological intervention; chronically anticoagulated using apixaban   Palpitations (PAC/PVCs)    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/2016   a.) Medtronic [id number] dual-chamber PPM (MRI compatiable); LEFT pectoral area   Pseudogout    Renal cyst, right    RLS (restless legs syndrome)    Sinus bradycardia    SOB (shortness of breath)    SSS (sick sinus syndrome) (HCC)    a.) s/p dual chamber PPM placement 06/28/2016   Past Surgical History:  Procedure Laterality Date   CARPAL TUNNEL RELEASE Left 04/24/2019   Procedure: CARPAL TUNNEL RELEASE;  Surgeon: Kennedy Bucker, MD;  Location: [facility] ORS;  Service: Orthopedics;  Laterality: Left;   CARPAL TUNNEL RELEASE Right 08/22/2023   Procedure: CARPAL TUNNEL RELEASE ENDOSCOPIC;  Surgeon: Christena Flake, MD;  Location: [facility] ORS;  Service: Orthopedics;  Laterality: Right;   CHOLECYSTECTOMY     COLONOSCOPY WITH PROPOFOL N/A 06/24/2015   Procedure: COLONOSCOPY WITH PROPOFOL;  Surgeon: Wallace Cullens, MD;  Location: [facility] ENDOSCOPY;  Service: Gastroenterology;  Laterality: N/A;   COLONOSCOPY WITH PROPOFOL N/A 03/15/2021   Procedure: COLONOSCOPY WITH PROPOFOL;  Surgeon: Regis Bill, MD;  Location: [facility] ENDOSCOPY;  Service: Endoscopy;  Laterality: N/A;  ELIQUIS -  WILL NEED MEDICAL CLEARANCE   KNEE ARTHROPLASTY Left 10/25/2015   Procedure: COMPUTER ASSISTED TOTAL KNEE ARTHROPLASTY;  Surgeon: Donato Heinz, MD;  Location: [facility] ORS;  Service: Orthopedics;  Laterality: Left;   KNEE ARTHROSCOPY Bilateral    LIPOMA EXCISION N/A    upper/mid aspect of back   PACEMAKER INSERTION N/A 06/28/2016   Procedure: INSERTION PACEMAKER;  Surgeon: Marcina Millard, MD;  Location: [facility] ORS;  Service: Cardiovascular;  Laterality: N/A;  left   PHOTOSELECTIVE VAPORIZATION OF THE PROSTATE (GREENLIGHT LASER) N/A    x 2   SHOULDER ARTHROSCOPY WITH OPEN ROTATOR CUFF REPAIR Right 12/18/2017   Procedure: SHOULDER ARTHROSCOPY WITH OPEN ROTATOR CUFF REPAIR;  Surgeon: Christena Flake, MD;  Location: [facility] ORS;  Service: Orthopedics;  Laterality: Right;   TOTAL KNEE ARTHROPLASTY Right 09/28/2010   VASECTOMY     Family History  Problem Relation Age of Onset   COPD Mother    Diabetes Father    Stroke Father    Heart disease Father    Social History   Tobacco Use   Smoking status: Former    Current packs/day: 0.00    Average packs/day: 1 pack/day for 5.0 years (5.0 ttl pk-yrs)    Types: Cigarettes    Start date: 02/16/1969    Quit date: 02/16/1974    Years since quitting: 49.8   Smokeless tobacco: Never   Tobacco comments:    quit 47 years  ago  Vaping Use   Vaping status: Never Used  Substance Use Topics   Alcohol use: No   Drug use: No    Pertinent Clinical Results:  LABS:   No visits with results within 3 Day(s) from this visit.  Latest known visit with results is:  Hospital Outpatient Visit on 12/06/2023  Component Date Value Ref Range Status   WBC 12/06/2023 5.4  4.0 - 10.5 K/uL Final   RBC 12/06/2023 4.61  4.22 - 5.81 MIL/uL Final   Hemoglobin 12/06/2023 14.1  13.0 - 17.0 g/dL Final   HCT 16/06/9603 39.9  39.0 - 52.0 % Final   MCV 12/06/2023 86.6  80.0 - 100.0 fL Final   MCH 12/06/2023 30.6  26.0 - 34.0 pg Final   MCHC 12/06/2023 35.3  30.0 - 36.0 g/dL  Final   RDW 54/05/8118 12.5  11.5 - 15.5 % Final   Platelets 12/06/2023 181  150 - 400 K/uL Final   nRBC 12/06/2023 0.0  0.0 - 0.2 % Final   Neutrophils Relative % 12/06/2023 75  % Final   Neutro Abs 12/06/2023 4.1  1.7 - 7.7 K/uL Final   Lymphocytes Relative 12/06/2023 13  % Final   Lymphs Abs 12/06/2023 0.7  0.7 - 4.0 K/uL Final   Monocytes Relative 12/06/2023 9  % Final   Monocytes Absolute 12/06/2023 0.5  0.1 - 1.0 K/uL Final   Eosinophils Relative 12/06/2023 3  % Final   Eosinophils Absolute 12/06/2023 0.2  0.0 - 0.5 K/uL Final   Basophils Relative 12/06/2023 0  % Final   Basophils Absolute 12/06/2023 0.0  0.0 - 0.1 K/uL Final   Immature Granulocytes 12/06/2023 0  % Final   Abs Immature Granulocytes 12/06/2023 0.02  0.00 - 0.07 K/uL Final   Performed at [facility], [address] [place], [place] [address]   Sodium 12/06/2023 137  135 - 145 mmol/L Final   Potassium 12/06/2023 3.6  3.5 - 5.1 mmol/L Final   Chloride 12/06/2023 101  98 - 111 mmol/L Final   CO2 12/06/2023 25  22 - 32 mmol/L Final   Glucose, Bld 12/06/2023 75  70 - 99 mg/dL Final   Glucose reference range applies only to samples taken after fasting for at least 8 hours.   BUN 12/06/2023 14  8 - 23 mg/dL Final   Creatinine, Ser 12/06/2023 0.85  0.61 - 1.24 mg/dL Final   Calcium 95/62/1308 9.7  8.9 - 10.3 mg/dL Final   Total Protein 65/78/4696 6.9  6.5 - 8.1 g/dL Final   Albumin 29/52/8413 4.4  3.5 - 5.0 g/dL Final   AST 24/40/1027 34  15 - 41 U/L Final   ALT 12/06/2023 31  0 - 44 U/L Final   Alkaline Phosphatase 12/06/2023 92  38 - 126 U/L Final   Total Bilirubin 12/06/2023 1.1  0.0 - 1.2 mg/dL Final   GFR, Estimated 12/06/2023 >60  >60 mL/min Final   Comment: (NOTE) Calculated using the CKD-EPI Creatinine Equation (2021)    Anion gap 12/06/2023 11  5 - 15 Final   Performed at [facility], [address] [place], [place] [address]   Color, Urine 12/06/2023 STRAW (A)  YELLOW Final    APPearance 12/06/2023 CLEAR (A)  CLEAR Final   Specific Gravity, Urine 12/06/2023 1.004 (L)  1.005 - 1.030 Final   pH 12/06/2023 7.0  5.0 - 8.0 Final   Glucose, UA 12/06/2023 NEGATIVE  NEGATIVE mg/dL Final   Hgb urine dipstick 12/06/2023 NEGATIVE  NEGATIVE Final   Bilirubin Urine 12/06/2023 NEGATIVE  NEGATIVE Final   Ketones, ur 12/06/2023 NEGATIVE  NEGATIVE mg/dL Final   Protein, ur 16/06/9603 NEGATIVE  NEGATIVE mg/dL Final   Nitrite 54/05/8118 NEGATIVE  NEGATIVE Final   Leukocytes,Ua 12/06/2023 NEGATIVE  NEGATIVE Final   Performed at [facility], [address] [place], [place] [address]   MRSA, PCR 12/06/2023 NEGATIVE  NEGATIVE Final   Staphylococcus aureus 12/06/2023 NEGATIVE  NEGATIVE Final   Comment: (NOTE) The Xpert SA Assay (FDA approved for NASAL specimens in patients 8 years of age and older), is one component of a comprehensive surveillance program. It is not intended to diagnose infection nor to guide or monitor treatment. Performed at [facility], [address] [place], [place] [address]     ECG: Date: 08/14/2023 Time ECG obtained: 1002 AM Rate: 70 bpm Rhythm: normal sinus Axis (leads I and aVF): Normal Intervals: PR 184 ms. QRS 88 ms. QTc 444 ms. ST segment and T wave changes: No evidence of acute ST segment elevation or depression.   Comparison: Previous tracing on 04/21/2019 showed an atrial paced rhythm with prolonged AV conduction   IMAGING / PROCEDURES: MRI SHOULDER LEFT WITHOUT CONTRAST performed on 10/21/2022 There is significant limitation secondary to the patient's cardiac  pacemaker causing shading artifact.  Large full-thickness rotator cuff tear of the supraspinatus and infraspinatus as detailed above. There is associated supraspinatus muscle atrophy and fatty infiltration as well as infraspinatus atrophy with associated edema like signal.  Findings concerning for partial tearing of the subscapularis tendon.  Although  assessment is limited secondary to artifact, there appears to be at least high-grade if not full-thickness tearing of the biceps tendon.  Findings raising the possibility of adhesive capsulitis.   TRANSTHORACIC ECHOCARDIOGRAM performed on 05/10/2016 Normal left ventricular systolic function with an EF of >55% Mild LVH Mild LEFT atrial enlargement Normal right ventricular size and function Trivial AR Mild MR and TR Normal gradients; no valvular stenosis No pericardial effusion  MYOCARDIAL PERFUSION IMAGING STUDY (LEXISCAN) performed on 04/23/2013 Exercise myocardial perfusion study with no significant ischemia.  No significant wall motion abnormality noted.  The estimated ejection fraction is 53%.  There are no EKG changes concerning for ischemia.  There is attenuation artifact noted on this study.   Impression and Plan:  Lamel [name] has been referred for pre-anesthesia review and clearance prior to him undergoing the planned anesthetic and procedural courses. Available labs, pertinent testing, and imaging results were personally reviewed by me in preparation for upcoming operative/procedural course. [facility] medical record has been updated following extensive record review and patient interview with PAT staff.   This patient has been appropriately cleared by cardiology with an overall LOW risk of experiencing significant perioperative cardiovascular complications. Completed perioperative prescription for cardiac device management documentation completed by primary cardiology team and placed on patient's chart for review by the surgical/anesthetic team on the day of his procedure. Electrophysiology indicating that procedure should not interfere with planned surgical procedure. Beyond normal perioperative cardiovascular monitoring, there are no recommendations from electrophysiology team. Per request from surgeon, industry representative Shari Prows) has been contact and made aware of upcoming  procedure on 12/18/2023. Surgeon requesting device reprogramming for the procedure rather than magnet use citing that magnet is within the operative field.    Based on clinical review performed today (12/11/23), barring any significant acute changes in the patient's overall condition, it is anticipated that he will be able to proceed with the planned surgical  intervention. Any acute changes in clinical condition may necessitate his procedure being postponed and/or cancelled. Patient will meet with anesthesia team (MD and/or CRNA) on the day of his procedure for preoperative evaluation/assessment. Questions regarding anesthetic course will be fielded at that time.   Pre-surgical instructions were reviewed with the patient during his PAT appointment, and questions were fielded to satisfaction by PAT clinical staff. He has been instructed on which medications that he will need to hold prior to surgery, as well as the ones that have been deemed safe/appropriate to take on the day of his procedure. As part of the general education provided by PAT, patient made aware both verbally and in writing, that he would need to abstain from the use of any illegal substances during his perioperative course.  He was advised that failure to follow the provided instructions could necessitate case cancellation or result in serious perioperative complications up to and including death. Patient encouraged to contact PAT and/or his surgeon's office to discuss any questions or concerns that may arise prior to surgery; verbalized understanding.   Quentin Mulling, MSN, APRN, FNP-C, CEN [facility]  Perioperative Services Nurse Practitioner Phone: [phone] Fax: [phone] 12/11/23 12:34 PM  NOTE: This note has been prepared using Dragon dictation software. Despite my best ability to proofread, there is always the potential that unintentional transcriptional errors may still occur from this process.

## 2023-12-18 ENCOUNTER — Ambulatory Visit: Payer: Self-pay | Admitting: Urgent Care

## 2023-12-18 ENCOUNTER — Ambulatory Visit

## 2023-12-18 ENCOUNTER — Ambulatory Visit
Admission: RE | Admit: 2023-12-18 | Discharge: 2023-12-18 | Disposition: A | Discharge status: Home / Self Care | Attending: Surgery | Admitting: Surgery

## 2023-12-18 ENCOUNTER — Encounter
Admission: RE | Disposition: A | Discharge status: Home / Self Care | Payer: Self-pay | Source: Home / Self Care | Attending: Surgery

## 2023-12-18 ENCOUNTER — Other Ambulatory Visit: Payer: Self-pay

## 2023-12-18 ENCOUNTER — Encounter: Payer: Self-pay | Admitting: Surgery

## 2023-12-18 DIAGNOSIS — M778 Other enthesopathies, not elsewhere classified: Secondary | ICD-10-CM | POA: Diagnosis not present

## 2023-12-18 DIAGNOSIS — Z7901 Long term (current) use of anticoagulants: Secondary | ICD-10-CM | POA: Diagnosis not present

## 2023-12-18 DIAGNOSIS — M1990 Unspecified osteoarthritis, unspecified site: Secondary | ICD-10-CM | POA: Diagnosis not present

## 2023-12-18 DIAGNOSIS — Z791 Long term (current) use of non-steroidal anti-inflammatories (NSAID): Secondary | ICD-10-CM | POA: Diagnosis not present

## 2023-12-18 DIAGNOSIS — Z96653 Presence of artificial knee joint, bilateral: Secondary | ICD-10-CM | POA: Insufficient documentation

## 2023-12-18 DIAGNOSIS — I495 Sick sinus syndrome: Secondary | ICD-10-CM | POA: Insufficient documentation

## 2023-12-18 DIAGNOSIS — Z8261 Family history of arthritis: Secondary | ICD-10-CM | POA: Diagnosis not present

## 2023-12-18 DIAGNOSIS — Z6835 Body mass index (BMI) 35.0-35.9, adult: Secondary | ICD-10-CM | POA: Insufficient documentation

## 2023-12-18 DIAGNOSIS — K219 Gastro-esophageal reflux disease without esophagitis: Secondary | ICD-10-CM | POA: Insufficient documentation

## 2023-12-18 DIAGNOSIS — M51369 Other intervertebral disc degeneration, lumbar region without mention of lumbar back pain or lower extremity pain: Secondary | ICD-10-CM | POA: Insufficient documentation

## 2023-12-18 DIAGNOSIS — Z950 Presence of cardiac pacemaker: Secondary | ICD-10-CM | POA: Insufficient documentation

## 2023-12-18 DIAGNOSIS — M75122 Complete rotator cuff tear or rupture of left shoulder, not specified as traumatic: Secondary | ICD-10-CM | POA: Insufficient documentation

## 2023-12-18 DIAGNOSIS — Z8249 Family history of ischemic heart disease and other diseases of the circulatory system: Secondary | ICD-10-CM | POA: Diagnosis not present

## 2023-12-18 DIAGNOSIS — I429 Cardiomyopathy, unspecified: Secondary | ICD-10-CM | POA: Diagnosis not present

## 2023-12-18 DIAGNOSIS — I10 Essential (primary) hypertension: Secondary | ICD-10-CM | POA: Diagnosis not present

## 2023-12-18 DIAGNOSIS — Z833 Family history of diabetes mellitus: Secondary | ICD-10-CM | POA: Diagnosis not present

## 2023-12-18 DIAGNOSIS — G4733 Obstructive sleep apnea (adult) (pediatric): Secondary | ICD-10-CM | POA: Insufficient documentation

## 2023-12-18 DIAGNOSIS — E119 Type 2 diabetes mellitus without complications: Secondary | ICD-10-CM | POA: Insufficient documentation

## 2023-12-18 DIAGNOSIS — I083 Combined rheumatic disorders of mitral, aortic and tricuspid valves: Secondary | ICD-10-CM | POA: Insufficient documentation

## 2023-12-18 DIAGNOSIS — I480 Paroxysmal atrial fibrillation: Secondary | ICD-10-CM | POA: Diagnosis not present

## 2023-12-18 DIAGNOSIS — Z87891 Personal history of nicotine dependence: Secondary | ICD-10-CM | POA: Insufficient documentation

## 2023-12-18 DIAGNOSIS — Z79899 Other long term (current) drug therapy: Secondary | ICD-10-CM | POA: Diagnosis not present

## 2023-12-18 HISTORY — PX: REVERSE SHOULDER ARTHROPLASTY: SHX5054

## 2023-12-18 HISTORY — DX: Unspecified rotator cuff tear or rupture of left shoulder, not specified as traumatic: M75.102

## 2023-12-18 SURGERY — ARTHROPLASTY, SHOULDER, TOTAL, REVERSE
Anesthesia: General | Site: Shoulder | Laterality: Left

## 2023-12-18 MED ORDER — ROCURONIUM BROMIDE 100 MG/10ML IV SOLN
INTRAVENOUS | Status: DC | PRN
  Administered 2023-12-18: 20 mg via INTRAVENOUS
  Administered 2023-12-18: 30 mg via INTRAVENOUS
  Administered 2023-12-18: 50 mg via INTRAVENOUS

## 2023-12-18 MED ORDER — TRANEXAMIC ACID-NACL 1000-0.7 MG/100ML-% IV SOLN
INTRAVENOUS | Status: AC
  Filled 2023-12-18: qty 100

## 2023-12-18 MED ORDER — FENTANYL CITRATE (PF) 100 MCG/2ML IJ SOLN
INTRAMUSCULAR | Status: AC
Start: 2023-12-18 — End: ?
  Filled 2023-12-18: qty 2

## 2023-12-18 MED ORDER — LACTATED RINGERS IV SOLN: INTRAVENOUS | Status: DC

## 2023-12-18 MED ORDER — KETOROLAC TROMETHAMINE 15 MG/ML IJ SOLN
15.0000 mg | Freq: Once | INTRAMUSCULAR | Status: AC
  Administered 2023-12-18: 15 mg via INTRAVENOUS

## 2023-12-18 MED ORDER — BUPIVACAINE-EPINEPHRINE (PF) 0.5% -1:200000 IJ SOLN
INTRAMUSCULAR | Status: DC | PRN
  Administered 2023-12-18: 30 mL via PERINEURAL

## 2023-12-18 MED ORDER — PHENYLEPHRINE 80 MCG/ML (10ML) SYRINGE FOR IV PUSH (FOR BLOOD PRESSURE SUPPORT)
PREFILLED_SYRINGE | INTRAVENOUS | Status: DC | PRN
  Administered 2023-12-18 (×2): 80 ug via INTRAVENOUS

## 2023-12-18 MED ORDER — BUPIVACAINE LIPOSOME 1.3 % IJ SUSP
INTRAMUSCULAR | Status: AC
  Filled 2023-12-18: qty 20

## 2023-12-18 MED ORDER — PHENYLEPHRINE HCL-NACL 20-0.9 MG/250ML-% IV SOLN
INTRAVENOUS | Status: DC | PRN
Start: 2023-12-18 — End: 2023-12-18
  Administered 2023-12-18: 10 ug/min via INTRAVENOUS

## 2023-12-18 MED ORDER — SODIUM CHLORIDE 0.9 % IV SOLN
INTRAVENOUS | Status: DC | PRN
  Administered 2023-12-18: 16 mL

## 2023-12-18 MED ORDER — SUGAMMADEX SODIUM 200 MG/2ML IV SOLN
INTRAVENOUS | Status: DC | PRN
  Administered 2023-12-18: 200 mg via INTRAVENOUS

## 2023-12-18 MED ORDER — ONDANSETRON HCL 4 MG/2ML IJ SOLN
INTRAMUSCULAR | Status: DC | PRN
  Administered 2023-12-18 (×2): 4 mg via INTRAVENOUS

## 2023-12-18 MED ORDER — ORAL CARE MOUTH RINSE: 15.0000 mL | Freq: Once | OROMUCOSAL | Status: AC

## 2023-12-18 MED ORDER — FENTANYL CITRATE (PF) 100 MCG/2ML IJ SOLN: 25.0000 ug | INTRAMUSCULAR | Status: DC | PRN

## 2023-12-18 MED ORDER — OXYCODONE HCL 5 MG/5ML PO SOLN: 5.0000 mg | Freq: Once | ORAL | Status: DC | PRN

## 2023-12-18 MED ORDER — CEFAZOLIN SODIUM-DEXTROSE 2-4 GM/100ML-% IV SOLN
INTRAVENOUS | Status: AC
  Filled 2023-12-18: qty 100

## 2023-12-18 MED ORDER — DEXMEDETOMIDINE HCL IN NACL 200 MCG/50ML IV SOLN
INTRAVENOUS | Status: DC | PRN
  Administered 2023-12-18: 8 ug via INTRAVENOUS

## 2023-12-18 MED ORDER — CHLORHEXIDINE GLUCONATE 0.12 % MT SOLN
OROMUCOSAL | Status: AC
  Filled 2023-12-18: qty 15

## 2023-12-18 MED ORDER — BUPIVACAINE-EPINEPHRINE (PF) 0.5% -1:200000 IJ SOLN
INTRAMUSCULAR | Status: AC
  Filled 2023-12-18: qty 30

## 2023-12-18 MED ORDER — BUPIVACAINE LIPOSOME 1.3 % IJ SUSP
INTRAMUSCULAR | Status: DC | PRN
  Administered 2023-12-18: 20 mL

## 2023-12-18 MED ORDER — CEFAZOLIN SODIUM-DEXTROSE 3-4 GM/150ML-% IV SOLN
3.0000 g | Freq: Once | INTRAVENOUS | Status: AC
  Administered 2023-12-18: 3 g via INTRAVENOUS
  Filled 2023-12-18: qty 150

## 2023-12-18 MED ORDER — FENTANYL CITRATE PF 50 MCG/ML IJ SOSY
50.0000 ug | PREFILLED_SYRINGE | Freq: Once | INTRAMUSCULAR | Status: AC
  Administered 2023-12-18: 50 ug via INTRAVENOUS

## 2023-12-18 MED ORDER — GLYCOPYRROLATE 0.2 MG/ML IJ SOLN
INTRAMUSCULAR | Status: DC | PRN
  Administered 2023-12-18: .2 mg via INTRAVENOUS

## 2023-12-18 MED ORDER — SODIUM CHLORIDE 0.9 % IR SOLN
Status: DC | PRN
  Administered 2023-12-18: 3000 mL

## 2023-12-18 MED ORDER — PROPOFOL 10 MG/ML IV BOLUS
INTRAVENOUS | Status: DC | PRN
  Administered 2023-12-18: 200 mg via INTRAVENOUS

## 2023-12-18 MED ORDER — CHLORHEXIDINE GLUCONATE 0.12 % MT SOLN
15.0000 mL | Freq: Once | OROMUCOSAL | Status: AC
  Administered 2023-12-18: 15 mL via OROMUCOSAL

## 2023-12-18 MED ORDER — DEXAMETHASONE SODIUM PHOSPHATE 10 MG/ML IJ SOLN
INTRAMUSCULAR | Status: DC | PRN
  Administered 2023-12-18: 10 mg via INTRAVENOUS

## 2023-12-18 MED ORDER — LIDOCAINE HCL (PF) 1 % IJ SOLN
INTRAMUSCULAR | Status: DC | PRN
  Administered 2023-12-18: 3 mL via INTRADERMAL

## 2023-12-18 MED ORDER — TRANEXAMIC ACID-NACL 1000-0.7 MG/100ML-% IV SOLN
1000.0000 mg | INTRAVENOUS | Status: AC
  Administered 2023-12-18: 1000 mg via INTRAVENOUS

## 2023-12-18 MED ORDER — TRAMADOL HCL 50 MG PO TABS: 50.0000 mg | ORAL_TABLET | Freq: Four times a day (QID) | ORAL | 0 refills | Status: DC | PRN

## 2023-12-18 MED ORDER — ACETAMINOPHEN 10 MG/ML IV SOLN
INTRAVENOUS | Status: DC | PRN
  Administered 2023-12-18: 1000 mg via INTRAVENOUS

## 2023-12-18 MED ORDER — EPHEDRINE SULFATE-NACL 50-0.9 MG/10ML-% IV SOSY
PREFILLED_SYRINGE | INTRAVENOUS | Status: DC | PRN
  Administered 2023-12-18 (×2): 5 mg via INTRAVENOUS

## 2023-12-18 MED ORDER — PHENYLEPHRINE HCL-NACL 20-0.9 MG/250ML-% IV SOLN
INTRAVENOUS | Status: AC
  Filled 2023-12-18: qty 250

## 2023-12-18 MED ORDER — OXYCODONE HCL 5 MG PO TABS: 5.0000 mg | ORAL_TABLET | Freq: Once | ORAL | Status: DC | PRN

## 2023-12-18 MED ORDER — LIDOCAINE HCL (CARDIAC) PF 100 MG/5ML IV SOSY
PREFILLED_SYRINGE | INTRAVENOUS | Status: DC | PRN
  Administered 2023-12-18: 20 mg via INTRAVENOUS

## 2023-12-18 MED ORDER — FENTANYL CITRATE PF 50 MCG/ML IJ SOSY
PREFILLED_SYRINGE | INTRAMUSCULAR | Status: AC
  Filled 2023-12-18: qty 1

## 2023-12-18 MED ORDER — CEFAZOLIN SODIUM-DEXTROSE 2-4 GM/100ML-% IV SOLN
2.0000 g | INTRAVENOUS | Status: AC
Start: 2023-12-18 — End: 2023-12-18
  Administered 2023-12-18: 1 g via INTRAVENOUS
  Administered 2023-12-18: 2 g via INTRAVENOUS

## 2023-12-18 MED ORDER — CEFAZOLIN SODIUM-DEXTROSE 1-4 GM/50ML-% IV SOLN
INTRAVENOUS | Status: AC
  Filled 2023-12-18: qty 50

## 2023-12-18 MED ORDER — LIDOCAINE HCL (PF) 1 % IJ SOLN
INTRAMUSCULAR | Status: AC
  Filled 2023-12-18: qty 5

## 2023-12-18 MED ORDER — KETOROLAC TROMETHAMINE 15 MG/ML IJ SOLN
INTRAMUSCULAR | Status: AC
  Filled 2023-12-18: qty 1

## 2023-12-18 MED ORDER — FENTANYL CITRATE (PF) 100 MCG/2ML IJ SOLN
INTRAMUSCULAR | Status: DC | PRN
  Administered 2023-12-18: 50 ug via INTRAVENOUS

## 2023-12-18 SURGICAL SUPPLY — 65 items
BIT DRILL FLUTED 3.0 STRL (BIT) IMPLANT
BLADE BOVIE TIP EXT 4 (BLADE) IMPLANT
BLADE SAW SAG 25X90X1.19 (BLADE) ×1 IMPLANT
CHLORAPREP W/TINT 26 (MISCELLANEOUS) ×1 IMPLANT
COOLER POLAR GLACIER W/PUMP (MISCELLANEOUS) ×1 IMPLANT
CUP SUT UNIV REVERS 36 NEUTRAL (Cup) IMPLANT
DRAPE INCISE IOBAN 66X45 STRL (DRAPES) ×1 IMPLANT
DRAPE SHEET LG 3/4 BI-LAMINATE (DRAPES) ×1 IMPLANT
DRAPE TABLE BACK 80X90 (DRAPES) ×1 IMPLANT
DRSG OPSITE POSTOP 4X8 (GAUZE/BANDAGES/DRESSINGS) ×1 IMPLANT
ELECT BLADE 4.0 EZ CLEAN MEGAD (MISCELLANEOUS) ×1 IMPLANT
ELECT CAUTERY BLADE 6.4 (BLADE) ×1 IMPLANT
ELECT REM PT RETURN 9FT ADLT (ELECTROSURGICAL) ×1 IMPLANT
ELECTRODE BLDE 4.0 EZ CLN MEGD (MISCELLANEOUS) ×1 IMPLANT
ELECTRODE REM PT RTRN 9FT ADLT (ELECTROSURGICAL) ×1 IMPLANT
GAUZE XEROFORM 1X8 LF (GAUZE/BANDAGES/DRESSINGS) ×1 IMPLANT
GLENOID UNI REV MOD 24 +2 LAT (Joint) IMPLANT
GLENOSPHERE 39+4 LAT/24 UNI RV (Joint) IMPLANT
GLOVE BIO SURGEON STRL SZ7.5 (GLOVE) ×4 IMPLANT
GLOVE BIO SURGEON STRL SZ8 (GLOVE) ×4 IMPLANT
GLOVE BIOGEL PI IND STRL 8 (GLOVE) ×2 IMPLANT
GLOVE INDICATOR 8.0 STRL GRN (GLOVE) ×1 IMPLANT
GOWN STRL REUS W/ TWL LRG LVL3 (GOWN DISPOSABLE) ×2 IMPLANT
GOWN STRL REUS W/ TWL XL LVL3 (GOWN DISPOSABLE) ×1 IMPLANT
HANDLE YANKAUER SUCT OPEN TIP (MISCELLANEOUS) ×1 IMPLANT
HOOD PEEL AWAY T7 (MISCELLANEOUS) ×3 IMPLANT
IMMOBILIZER SHDR XL LX WHT (SOFTGOODS) IMPLANT
INSERT GLENOID REV 36 +6 /33 (Insert) IMPLANT
KIT STABILIZATION SHOULDER (MISCELLANEOUS) ×1 IMPLANT
KIT TURNOVER KIT A (KITS) ×1 IMPLANT
MANIFOLD NEPTUNE II (INSTRUMENTS) ×1 IMPLANT
MASK FACE SPIDER DISP (MASK) ×1 IMPLANT
MAT ABSORB FLUID 56X50 GRAY (MISCELLANEOUS) ×1 IMPLANT
NDL MAYO CATGUT SZ1 (NEEDLE) IMPLANT
NDL SAFETY ECLIPSE 18X1.5 (NEEDLE) ×1 IMPLANT
NDL SPNL 20GX3.5 QUINCKE YW (NEEDLE) ×1 IMPLANT
NEEDLE MAYO CATGUT SZ1 (NEEDLE) IMPLANT
NEEDLE SPNL 20GX3.5 QUINCKE YW (NEEDLE) ×1 IMPLANT
PACK ARTHROSCOPY SHOULDER (MISCELLANEOUS) ×1 IMPLANT
PAD ARMBOARD POSITIONER FOAM (MISCELLANEOUS) ×1 IMPLANT
PAD WRAPON POLAR SHDR UNIV (MISCELLANEOUS) ×1 IMPLANT
PIN NITINOL TARGETER 2.8 (PIN) IMPLANT
PULSAVAC PLUS IRRIG FAN TIP (DISPOSABLE) ×1 IMPLANT
SCREW CENTRAL MOD 30MM (Screw) IMPLANT
SCREW PERI LOCK 5.5X24 (Screw) IMPLANT
SCREW PERI LOCK 5.5X32 (Screw) IMPLANT
SCREW PERIPHERAL 5.5X20 LOCK (Screw) IMPLANT
SLING ULTRA II LG (MISCELLANEOUS) IMPLANT
SLING ULTRA II M (MISCELLANEOUS) IMPLANT
SOL .9 NS 3000ML IRR UROMATIC (IV SOLUTION) ×1 IMPLANT
SPONGE T-LAP 18X18 ~~LOC~~+RFID (SPONGE) ×2 IMPLANT
STAPLER SKIN PROX 35W (STAPLE) ×1 IMPLANT
STEM HUM UNIV REV SZ 10 (Stem) IMPLANT
SUT ETHIBOND 0 MO6 C/R (SUTURE) ×1 IMPLANT
SUT FIBERWIRE #2 38 BLUE 1/2 (SUTURE) ×4 IMPLANT
SUT VIC AB 0 CT1 36 (SUTURE) ×1 IMPLANT
SUT VIC AB 2-0 CT1 TAPERPNT 27 (SUTURE) ×2 IMPLANT
SUTURE FIBERWR #2 38 BLUE 1/2 (SUTURE) ×4 IMPLANT
SYR 10ML LL (SYRINGE) ×1 IMPLANT
SYR 30ML LL (SYRINGE) ×1 IMPLANT
SYR TOOMEY 50ML (SYRINGE) ×1 IMPLANT
TIP FAN IRRIG PULSAVAC PLUS (DISPOSABLE) ×1 IMPLANT
TRAP FLUID SMOKE EVACUATOR (MISCELLANEOUS) ×1 IMPLANT
WATER STERILE IRR 500ML POUR (IV SOLUTION) ×1 IMPLANT
WRAPON POLAR PAD SHDR UNIV (MISCELLANEOUS) ×1 IMPLANT

## 2023-12-18 NOTE — Anesthesia Procedure Notes (Signed)
 Anesthesia Regional Block: Interscalene brachial plexus block   Pre-Anesthetic Checklist: , timeout performed,  Correct Patient, Correct Site, Correct Laterality,  Correct Procedure, Correct Position, site marked,  Risks and benefits discussed,  Surgical consent,  Pre-op evaluation,  At surgeon's request and post-op pain management  Laterality: Left  Prep: alcohol swabs       Needles:  Injection technique: Single-shot  Needle Type: Echogenic Needle     Needle Length: 4cm  Needle Gauge: 25     Additional Needles:   Procedures:,,,, ultrasound used (permanent image in chart),,    Narrative:  Start time: 12/18/2023 7:26 AM End time: 12/18/2023 7:28 AM Injection made incrementally with aspirations every 5 mL.  Performed by: Personally  Anesthesiologist: Louie Boston, MD  Additional Notes: Patient's chart reviewed and they were deemed appropriate candidate for procedure, at surgeon's request. Patient educated about risks, benefits, and alternatives of the block including but not limited to: temporary or permanent nerve damage, bleeding, infection, damage to surround tissues, pneumothorax, hemidiaphragmatic paralysis, unilateral Horner's syndrome, block failure, local anesthetic toxicity. Patient expressed understanding. A formal time-out was conducted consistent with institution rules.  Monitors were applied, and minimal sedation used (see nursing record). The site was prepped with skin prep and allowed to dry, and sterile gloves were used. A high frequency linear ultrasound probe with probe cover was utilized throughout. C5-7 nerve roots located and appeared anatomically normal, local anesthetic injected around them, and echogenic block needle trajectory was monitored throughout. Aspiration performed every 5ml. Lung and blood vessels were avoided. All injections were performed without resistance and free of blood and paresthesias. The patient tolerated the procedure well.  Injectate:  20ml exparel

## 2023-12-18 NOTE — Progress Notes (Signed)
 Patient meets all discharge requirements to safely be discharged home. Ambulates well, tolerates po intake, and met all requirements per Florentina Addison, OT. All questions answered to patient and family satisfaction. Wife is present with the patient at bedside.

## 2023-12-18 NOTE — Discharge Instructions (Addendum)
 Orthopedic discharge instructions: May shower with intact OpSite dressing once nerve block has worn off (Saturday).  Apply ice frequently to shoulder. Resume Celebrex daily as prescribed. Take tramadol as prescribed when needed.  May supplement with ES Tylenol if necessary. Resume Eliquis as prescribed tomorrow morning. Keep shoulder immobilizer on at all times except may remove for bathing purposes. Follow-up in 10-14 days or as scheduled.  POLAR CARE INFORMATION  MassAdvertisement.it  How to use [employer] Polar Care [facility] Therapy System?  YouTube   ShippingScam.co.uk  OPERATING INSTRUCTIONS  Start the product With dry hands, connect the transformer to the electrical connection located on the top of the cooler. Next, plug the transformer into an appropriate electrical outlet. The unit will automatically start running at this point.  To stop the pump, disconnect electrical power.  Unplug to stop the product when not in use. Unplugging the Polar Care unit turns it off. Always unplug immediately after use. Never leave it plugged in while unattended. Remove pad.    FIRST ADD WATER TO FILL LINE, THEN ICE---Replace ice when existing ice is almost melted  1 Discuss Treatment with your Licensed Health Care Practitioner and Use Only as Prescribed 2 Apply Insulation Barrier & Cold Therapy Pad 3 Check for Moisture 4 Inspect Skin Regularly  Tips and Trouble Shooting Usage Tips 1. Use cubed or chunked ice for optimal performance. 2. It is recommended to drain the Pad between uses. To drain the pad, hold the Pad upright with the hose pointed toward the ground. Depress the black plunger and allow water to drain out. 3. You may disconnect the Pad from the unit without removing the pad from the affected area by depressing the silver tabs on the hose coupling and gently pulling the hoses apart. The Pad and unit will seal itself and will not leak. Note: Some dripping during  release is normal. 4. DO NOT RUN PUMP WITHOUT WATER! The pump in this unit is designed to run with water. Running the unit without water will cause permanent damage to the pump. 5. Unplug unit before removing lid.  TROUBLESHOOTING GUIDE Pump not running, Water not flowing to the pad, Pad is not getting cold 1. Make sure the transformer is plugged into the wall outlet. 2. Confirm that the ice and water are filled to the indicated levels. 3. Make sure there are no kinks in the pad. 4. Gently pull on the blue tube to make sure the tube/pad junction is straight. 5. Remove the pad from the treatment site and ll it while the pad is lying at; then reapply. 6. Confirm that the pad couplings are securely attached to the unit. Listen for the double clicks (Figure 1) to confirm the pad couplings are securely attached.  Leaks    Note: Some condensation on the lines, controller, and pads is unavoidable, especially in warmer climates. 1. If using a Breg Polar Care Cold Therapy unit with a detachable Cold Therapy Pad, and a leak exists (other than condensation on the lines) disconnect the pad couplings. Make sure the silver tabs on the couplings are depressed before reconnecting the pad to the pump hose; then confirm both sides of the coupling are properly clicked in. 2. If the coupling continues to leak or a leak is detected in the pad itself, stop using it and call [employer] Customer Care at [phone].  Cleaning After use, empty and dry the unit with a soft cloth. Warm water and mild detergent may be used occasionally to clean  the pump and tubes.  WARNING: The Polar Care Cube can be cold enough to cause serious injury, including full skin necrosis. Follow these Operating Instructions, and carefully read the Product Insert (see pouch on side of unit) and the Cold Therapy Pad Fitting Instructions (provided with each Cold Therapy Pad) prior to use.  SHOULDER SLING IMMOBILIZER   VIDEO Slingshot 2 Shoulder  Brace Application - YouTube ---https://www.porter.info/  INSTRUCTIONS While supporting the injured arm, slide the forearm into the sling. Wrap the adjustable shoulder strap around the neck and shoulders and attach the strap end to the sling using  the "alligator strap tab."  Adjust the shoulder strap to the required length. Position the shoulder pad behind the neck. To secure the shoulder pad location (optional), pull the shoulder strap away from the shoulder pad, unfold the hook material on the top of the pad, then press the shoulder strap back onto the hook material to secure the pad in place. Attach the closure strap across the open top of the sling. Position the strap so that it holds the arm securely in the sling. Next, attach the thumb strap to the open end of the sling between the thumb and fingers. After sling has been fit, it may be easily removed and reapplied using the quick release buckle on shoulder strap. If a neutral pillow or 15 abduction pillow is included, place the pillow at the waistline. Attach the sling to the pillow, lining up hook material on the pillow with the loop on sling. Adjust the waist strap to fit.  If waist strap is too long, cut it to fit. Use the small piece of double sided hook material (located on top of the pillow) to secure the strap end. Place the double sided hook material on the inside of the cut strap end and secure it to the waist strap.     If no pillow is included, attach the waist strap to the sling and adjust to fit.    Washing Instructions: Straps and sling must be removed and cleaned regularly depending on your activity level and perspiration. Hand wash straps and sling in cold water with mild detergent, rinse, air dry        Interscalene Nerve Block with Exparel   For your surgery you have received an Interscalene Nerve Block with Exparel. Nerve Blocks affect many types of nerves, including nerves that control movement, pain  and normal sensation.  You may experience feelings such as numbness, tingling, heaviness, weakness or the inability to move your arm or the feeling or sensation that your arm has "fallen asleep". A nerve block with Exparel can last up to 5 days.  Usually the weakness wears off first.  The tingling and heaviness usually wear off next.  Finally you may start to notice pain.  Keep in mind that this may occur in any order.  Once a nerve block starts to wear off it is usually completely gone within 60 minutes. ISNB may cause mild shortness of breath, a hoarse voice, blurry vision, unequal pupils, or drooping of the face on the same side as the nerve block.  These symptoms will usually resolve with the numbness.  Very rarely the procedure itself can cause mild seizures. If needed, your surgeon will give you a prescription for pain medication.  It will take about 60 minutes for the oral pain medication to become fully effective.  So, it is recommended that you start taking this medication before the nerve block first begins to  wear off, or when you first begin to feel discomfort. Take your pain medication only as prescribed.  Pain medication can cause sedation and decrease your breathing if you take more than you need for the level of pain that you have. Nausea is a common side effect of many pain medications.  You may want to eat something before taking your pain medicine to prevent nausea. After an Interscalene nerve block, you cannot feel pain, pressure or extremes in temperature in the effected arm.  Because your arm is numb it is at an increased risk for injury.  To decrease the possibility of injury, please practice the following:  While you are awake change the position of your arm frequently to prevent too much pressure on any one area for prolonged periods of time.  If you have a cast or tight dressing, check the color or your fingers every couple of hours.  Call your surgeon with the appearance of any  discoloration (white or blue). If you are given a sling to wear before you go home, please wear it  at all times until the block has completely worn off.  Do not get up at night without your sling. Please contact [facility] or your surgeon if you do not begin to regain sensation after 7 days from the surgery.  Anesthesia may be contacted by calling the Same Day Surgery Department, Mon. through Fri., 6 am to 4 pm at [phone].   If you experience any other problems or concerns, please contact your surgeon's office. If you experience severe or prolonged shortness of breath go to the nearest emergency department.

## 2023-12-18 NOTE — Anesthesia Postprocedure Evaluation (Signed)
 Anesthesia Post Note  Patient: [name]  Procedure(s) Performed: ARTHROPLASTY, SHOULDER, TOTAL, REVERSE (Left: Shoulder)  Patient location during evaluation: PACU Anesthesia Type: General Level of consciousness: awake and alert Pain management: pain level controlled Vital Signs Assessment: post-procedure vital signs reviewed and stable Respiratory status: spontaneous breathing, nonlabored ventilation, respiratory function stable and patient connected to nasal cannula oxygen Cardiovascular status: blood pressure returned to baseline and stable Postop Assessment: no apparent nausea or vomiting Anesthetic complications: no   No notable events documented.   Last Vitals:  Vitals:   12/18/23 1103 12/18/23 1121  BP: 127/69 135/79  Pulse: 64 64  Resp:  15  Temp: (!) 36.1 C   SpO2: 96% 98%    Last Pain:  Vitals:   12/18/23 1121  TempSrc: Temporal  PainSc: 0-No pain                 Louie Boston

## 2023-12-18 NOTE — Anesthesia Procedure Notes (Addendum)
 Procedure Name: Intubation Date/Time: 12/18/2023 7:46 AM  Performed by: Mohammed Kindle, CRNAPre-anesthesia Checklist: Patient identified, Emergency Drugs available, Suction available and Patient being monitored Patient Re-evaluated:Patient Re-evaluated prior to induction Oxygen Delivery Method: Circle system utilized Preoxygenation: Pre-oxygenation with 100% oxygen Induction Type: IV induction Ventilation: Mask ventilation without difficulty Laryngoscope Size: McGrath and 3 Grade View: Grade I Tube type: Oral Tube size: 7.5 mm Number of attempts: 1 Airway Equipment and Method: Stylet Placement Confirmation: ETT inserted through vocal cords under direct vision, positive ETCO2 and breath sounds checked- equal and bilateral Secured at: 21 cm Tube secured with: Tape Dental Injury: Teeth and Oropharynx as per pre-operative assessment  Difficulty Due To: Difficult Airway-  due to neck instability Comments: Strict neutral alignment maintained throughout entire procedure w/o incdent TFH CRNA

## 2023-12-18 NOTE — H&P (Signed)
 History of Present Illness:  [name] is a 73 y.o. male who presents for a history and physical for an upcoming left reverse total shoulder replacement to be done by Dr. Joice Lofts on December 18, 2023. The patient saw Dr. Joice Lofts on November 02, 2023 for follow-up of his left shoulder pain secondary to impingement/tendinopathy with a probable recurrent rotator cuff tear. The patient was last seen for these symptoms 3 weeks ago. He notes only minimal improvement in his symptoms since his last visit. He has been taking Celebrex and Tylenol with limited benefit. He continues to have pain with any activities at or above shoulder level, as well as when trying to reach behind his back. He also continues to have pain at night, especially if he tries to sleep on his left side. He denies any reinjury to the shoulder, and denies any numbness or paresthesias down his arm to his hand. Since his last visit, he has undergone an MRI scan of the left shoulder and presents today to review these results. The patient is a diabetic.  Current Outpatient Medications:  acetaminophen (TYLENOL) 650 MG ER tablet Take 1 tablet (650 mg total) by mouth every 8 (eight) hours as needed for Pain  amLODIPine (NORVASC) 2.5 MG tablet take 1 tablet by mouth every day 90 tablet 3  amoxicillin (AMOXIL) 500 MG capsule TAKE 4 CAPSULES BY MOUTH 1 HR PRIOR TO DENTAL TREATMENT  ASCORBATE CALCIUM (VITAMIN C ORAL) Take 1 tablet by mouth once daily.  atorvastatin (LIPITOR) 10 MG tablet TAKE 1 TABLET BY MOUTH EVERY DAY 90 tablet 3  CALCIUM CARBONATE (CALCIUM 600 ORAL) Take 1 tablet by mouth once daily  celecoxib (CELEBREX) 200 MG capsule TAKE 1 CAPSULE (200 MG TOTAL) BY MOUTH ONCE DAILY 90 capsule 3  doxazosin (CARDURA) 8 MG tablet TAKE 1 TABLET (8 MG TOTAL) BY MOUTH ONCE DAILY 90 tablet 3  ELIQUIS 5 mg tablet TAKE 1 TABLET BY MOUTH TWICE A DAY 180 tablet 3  ERGOCALCIFEROL, VITAMIN D2, (VITAMIN D3 ORAL) Take 1 capsule by mouth once daily.    FUROsemide (LASIX) 40 MG tablet TAKE 1 TABLET (40 MG TOTAL) BY MOUTH ONCE DAILY AS NEEDED. 90 tablet 3  gabapentin (NEURONTIN) 100 MG capsule TAKE 100 mg in the morning 90 capsule 3  gabapentin (NEURONTIN) 400 MG capsule Take 400mg  nightly 90 capsule 3  hydroxychloroquine (PLAQUENIL) 200 mg tablet Take 1 tablet (200 mg total) by mouth 2 (two) times daily 180 tablet 1  losartan-hydroCHLOROthiazide (HYZAAR) 100-25 mg tablet TAKE 1 TABLET BY MOUTH EVERY DAY 90 tablet 1  MULTIVITAMIN ORAL Take 1 tablet by mouth once daily.  omega-3 fatty acids-fish oil 360-1,200 mg Cap Take by mouth Take 1,200 mg by mouth 2 (two) times a day.  pantoprazole (PROTONIX) 40 MG DR tablet Take 1 tablet (40 mg total) by mouth 2 (two) times daily before meals 180 tablet 3   Allergies:  Antihistamine [Diphenhydramine-Tripelen-Menth] (Hallucination, Depression, Sleep loss)  Adhesive Other (Blistering (Tegaderm)  Band-Aid Plus Antibiotic [Bacitracin Zinc-Polymyxin B] Other (Burns the skin causing blisters).  Loratadine Other (Depression with all antihistamines)  Tegaderm Ag Mesh [Silver] Other (Burns the skin causing blisters)   Past Medical History:  BPH (benign prostatic hypertrophy)  Cardiac pacemaker 02/26/2017  dual-chamber pacemaker implantation on 06/28/2016 for SSS  Cardiomyopathy, secondary (CMS/HHS-HCC)  Diverticulosis 05/05/2010  ED (erectile dysfunction)  GERD (gastroesophageal reflux disease)  Heart palpitations 04/24/2016  Hematologic abnormality  HTN (hypertension) 12/25/2013  Hypertension  Impaired glucose tolerance 08/18/2015  Neck pain  Obesity  Paroxysmal atrial fibrillation (CMS/HHS-HCC) 02/26/2017  Paroxysmal atrial fibrillation (CMS/HHS-HCC)  Premature ventricular contractions 04/24/2016  Restless leg syndrome 08/18/2015  Seasonal allergies  Sleep apnea  Tubular adenoma of colon 05/25/2006   Past Surgical History:  COLONOSCOPY 05/2006 (at which time polyp was resected, tubular  adenomatous)  KNEE ARTHROSCOPY 07/03/2006 (Right knee meniscal repair, arthroscopic)  COLONOSCOPY 05/05/2010  CHOLECYSTECTOMY 05/2010 (laparoscopic)  Right total knee replacement 09/28/2010 (Dr. Ernest Pine ) COLONOSCOPY 06/24/2015 (Diverticulosis/Otherwise normal/PHx of polyps/FHx of Colon polyps-Father/Repeat 29yrs/PYO)  Left total knee arthroplasty 10/25/2015 (Dr. Ernest Pine)  INSERT / REPLACE / REMOVE PACEMAKER 06/28/2016  CARDIAC SURGERY 06/28/2016 [facility])  Limited arthroscopic debridement, arthroscopic subacromial decompression, mini-open rotator cuff repair, and mini-open biceps tenodesis, right shoulder. Right 12/18/2017 (Dr. Joice Lofts)  Right Carpal Tunnel Release Left 04/24/2019 (Gerrit Rafalski)  COLONOSCOPY 03/15/2021 (Tubular adenomas/PHx CP/Repeat 73yrs/CTL)  Excision of lipoma  KNEE ARTHROSCOPY (Left knee meniscal repair, arthroscopic)  PROSTATE SURGERY (prostate reduction)  VASECTOMY 1977   Family History:  Arthritis Mother Gardiner Ramus Allers  Atrial fibrillation (Abnormal heart rhythm sometimes requiring treatment with blood thinners) Mother Gardiner Ramus Wander  Osteoarthritis Mother Stoy Fenn  Stroke Father Charles Petrosian  High blood pressure (Hypertension) Father Magazine features editor  Colon cancer Father Charles Arney  Diabetes Father Charles Tabora  Coronary Artery Disease (Blocked arteries around heart) Father Charles Boothe  Hyperlipidemia (Elevated cholesterol) Father Charles Muegge  Diabetes type II Father Magazine features editor  Colon polyps Brother Customer service manager  Stroke Brother Duaine Weant  Ulcers Brother Duaine Gordin  Arthritis Brother David Flink  High blood pressure (Hypertension) Brother Onalee Hua Early  Diabetes type II Brother David Heinbaugh  Asthma Maternal Grandfather Gurney Maxin   Social History:   Socioeconomic History:  Marital status: Married  Spouse name: Bonita Quin  Number of children: 2  Years of education: 18  Highest education level: Master's degree (e.g., MA, MS, MEng, MEd, MSW, MBA)   Occupational History  Occupation: Retired - Clinical biochemist  Tobacco Use  Smoking status: Former  Current packs/day: 0.00  Average packs/day: 1 pack/day for 5.0 years (5.0 ttl pk-yrs)  Types: Cigarettes  Start date: 04/18/1969  Quit date: 02/16/1974  Years since quitting: 49.8  Smokeless tobacco: Never  Tobacco comments:  light smoker  Vaping Use  Vaping status: Never Used  Substance and Sexual Activity  Alcohol use: Never  Drug use: No  Sexual activity: Yes  Partners: Female   Social Drivers of Health:   Physicist, medical Strain: Low Risk (12/05/2023)  Overall Financial Resource Strain (CARDIA)  Difficulty of Paying Living Expenses: Not hard at all  Food Insecurity: No Food Insecurity (12/05/2023)  Hunger Vital Sign  Worried About Running Out of Food in the Last Year: Never true  Ran Out of Food in the Last Year: Never true  Transportation Needs: No Transportation Needs (12/05/2023)  PRAPARE - Risk analyst (Medical): No  Lack of Transportation (Non-Medical): No   Review of Systems:  A comprehensive 14 point ROS was performed, reviewed, and the pertinent orthopaedic findings are documented in the HPI.  Physical Exam: Vitals:  12/05/23 1433 12/05/23 1434  BP: (!) 148/88  Weight: (!) 129.7 kg (286 lb)  Height: 180.3 cm (5\' 11" )  PainSc: 3 3  PainLoc: Shoulder Shoulder   General/Constitutional: Pleasant overweight elderly male in no acute distress. Neuro/Psych: Normal mood and affect, oriented to person, place and time. Eyes: Non-icteric. Pupils are equal, round, and reactive to light, and exhibit synchronous movement. ENT: Unremarkable. Lymphatic: No palpable adenopathy. Respiratory: Lungs clear  to auscultation, Normal chest excursion, No wheezes, and Non-labored breathing Cardiovascular: Regular rate and rhythm. No murmurs. and No edema, swelling or tenderness, except as noted in detailed exam. Integumentary: No impressive skin lesions  present, except as noted in detailed exam. Musculoskeletal: Unremarkable, except as noted in detailed exam.  Heart: Examination of the heart reveals regular, rate, and rhythm. There is no murmur noted on ascultation. There is a normal apical pulse.  Lungs: Lungs are clear to auscultation. There is no wheeze, rhonchi, or crackles. There is normal expansion of bilateral chest walls.   Left shoulder exam: SKIN: Normal SWELLING: None WARMTH: None LYMPH NODES: No adenopathy palpable CREPITUS: None TENDERNESS: Mild-moderately tender to palpation along anterolateral acromion ROM (active):  Forward flexion: 145 degrees (increased pain as he forward flexes through 90 to 120 degrees) Abduction: 135 degrees (increased pain as he forward flexes through 90 to 120 degrees) Internal rotation: L2 ROM (passive):  Forward flexion: 155 degrees Abduction: 145 degrees ER/IR at 90 abd: 90 degrees/50 degrees  He describes moderate pain at the extremes of all motions.  STRENGTH: Forward flexion: 3+/5 Abduction: 4/5 External rotation: 3+-4/5 Internal rotation: 4-4+/5 Pain with RC testing: Moderate pain with resisted forward flexion and mild pain with resisted abduction and external rotation  STABILITY: Normal  SPECIAL TESTS: Juanetta Gosling' test: Moderately positive Speed's test: Positive Capsulitis - pain w/ passive ER: No Crossed arm test: Mild - moderately positive Crank: Not evaluated Anterior apprehension: Negative Posterior apprehension: Not evaluated  He remains grossly neurovascularly intact to the left upper extremity and hand.  X-rays/MRI/Lab data:  A recent MRI scan of the left shoulder is available for review and has been reviewed by myself. By report, the study demonstrates evidence of a recurrent full-thickness tear involving the entire supraspinatus tendon with extension into the anterior half of the infraspinatus tendon with further partial-thickness tearing involving the posterior  infraspinatus tendon fibers. In addition, there is evidence of tendinopathy with partial-thickness interstitial tearing of the superior insertional fibers of the subscapularis tendon. There is moderate atrophy with fatty infiltration of the supraspinatus and infraspinatus muscles, as well as the superior portion of the subscapularis muscle. The biceps tendon appears to have chronically ruptured and retracted distally. Moderate degenerative changes of the glenohumeral joint also are noted. Both the films and report were reviewed by myself and discussed with the patient and his wife.  Assessment: 1. Nontraumatic complete tear of left rotator cuff.  2. Rotator cuff tendinitis, left  3. Severe obesity with body mass index (BMI) of 35.0 to 39.9 with comorbidity (CMS/HHS-HCC)  4. Diet-controlled type 2 diabetes mellitus (CMS/HHS-HCC)   Plan: The treatment options were discussed with the patient and his wife. In addition, patient educational materials were provided regarding the diagnosis and treatment options. The patient is quite frustrated by his continued symptoms and functional limitations, and is ready to consider more aggressive treatment options. Therefore, I have recommended a surgical procedure, specifically a reverse left total shoulder arthroplasty. The procedure was discussed with the patient, as were the potential risks (including bleeding, infection, nerve and/or blood vessel injury, persistent or recurrent pain, loosening and/or failure of the components, dislocation, leg length inequality, need for further surgery, blood clots, strokes, heart attacks and/or arhythmias, pneumonia, etc.) and benefits. The patient states his understanding and wishes to proceed. All of the patient's questions and concerns were answered. He can call any time with further concerns. He will follow up post-surgery, routine.    H&P reviewed and patient re-examined. No changes.

## 2023-12-18 NOTE — Op Note (Signed)
 12/18/2023  9:51 AM  Patient:   [name]  Pre-Op Diagnosis:   Massive irreparable rotator cuff tear with early cuff arthropathy, left shoulder.  Post-Op Diagnosis:   Same with biceps tendinopathy.  Procedure:   Reverse left total shoulder arthroplasty with biceps tenodesis.  Surgeon:   Maryagnes Amos, MD  Assistant:   Horris Latino, PA-C; Martie Round, PA-S  Anesthesia:   General endotracheal with an interscalene block using Exparel placed preoperatively by the anesthesiologist.  Findings:   As above.  Complications:   None  EBL:   150 cc  Fluids:   500 cc crystalloid  UOP:   None  TT:   None  Drains:   None  Closure:   Staples  Implants:   All press-fit Arthrex system with a #10 [employer] Revers humeral stem, a 36/39 mm SutureCup with a +3 mm insert, and a 24 mm base plate with a 39 mm +4 mm lateralized glenosphere.  Brief Clinical Note:   The patient is a 73 year old male with a history of progressively worsening left shoulder pain and weakness. His symptoms have progressed despite medications, activity modification, etc. His history and examination consistent with a massive rotator cuff tear with early cuff arthropathy, all of which were confirmed by preoperative MRI scanning. The patient presents at this time for a reverse left total shoulder arthroplasty with biceps tenodesis.  Procedure:   The patient underwent placement of an interscalene block using Exparel by the anesthesiologist in the preoperative holding area before being brought into the operating room and lain in the supine position. The patient then underwent general endotracheal intubation and anesthesia before the patient was repositioned in the beach chair position using the beach chair positioner. The left shoulder and upper extremity were prepped with ChloraPrep solution before being draped sterilely. Preoperative antibiotics were administered. A timeout was performed to verify the appropriate surgical  site.    A standard anterior approach to the shoulder was made through an approximately 4-5 inch incision. The incision was carried down through the subcutaneous tissues to expose the deltopectoral fascia. The interval between the deltoid and pectoralis muscles was identified and this plane developed, retracting the cephalic vein laterally with the deltoid muscle. The conjoined tendon was identified. Its lateral margin was dissected and the Kolbel self-retraining retractor inserted. The "three sisters" were identified and cauterized. Bursal tissues were removed to improve visualization.   The biceps tendon was identified near the inferior aspect of the bicipital groove. A soft tissue tenodesis was performed by attaching the biceps tendon to the adjacent pectoralis major tendon using two #0 Ethibond interrupted sutures. The biceps tendon was then transected just proximal to the tenodesis site. The subscapularis tendon was released from its attachment to the lesser tuberosity 1 cm proximal to its insertion and several tagging sutures placed. The inferior capsule was released with care after identifying and protecting the axillary nerve. The proximal humeral cut was made at approximately 30 of retroversion using the extra-medullary guide.   Attention was redirected to the glenoid. The labrum was debrided circumferentially before the center of the glenoid was marked with electrocautery. The guidewire was drilled into the glenoid neck using the appropriate guide. After verifying its position, it was overreamed with the baseplate reamer to create a flat surface before the peripheral reamer was introduced to clean off any peripheral soft tissues. The central 10 mm coring reamer was then inserted before the hole was tapped to complete the glenoid preparation. The permanent mini-baseplate construct  with a 30 mm screw attachment was screwed into place and tightened securely. The baseplate was then further secured using  four peripheral locking screws. The permanent 39 mm +4 mm lateralized glenosphere was then impacted into place and its Morse taper locking mechanism verified using manual distraction. Finally, the glenosphere locking screw was inserted and tightened securely.  Attention was directed to the humeral side. The humeral canal was reamed with first the 5 mm and then the 6 mm reamer. The canal was broached beginning with a #5 broach and progressing to a #10 broach. This was left in place and the metaphyseal inset reamer was used to create the metaphyseal socket.  A trial reduction performed using the 36/39 mm trial humeral platform with the +3 mm insert. The arm demonstrated excellent range of motion as the hand could be brought across the chest to the opposite shoulder and brought to the top of the patient's head and to the patient's ear. The shoulder appeared stable throughout this range of motion. The joint was dislocated and the trial components removed. The permanent #10 [employer] Revers stem was impacted into place with care taken to maintain the appropriate version. The permanent 36/39 mm SutureCup humeral platform was then impacted into place before the +3 mm insert was snapped into place. The shoulder was relocated using two finger pressure and again placed through a range of motion with the findings as described above.  The wound was copiously irrigated with sterile saline solution using the jet lavage system before a solution of 40 cc of 0.5% Sensorcaine with epinephrine and 10 cc of Exparel was injected into the pericapsular and peri-incisional tissues to help with postoperative analgesia. The subscapularis tendon was reapproximated using #2 FiberWire interrupted sutures. The deltopectoral interval was closed using #0 Vicryl interrupted sutures before the subcutaneous tissues were closed using 2-0 Vicryl interrupted sutures. The skin was closed using staples. A sterile occlusive dressing was applied to the  wound before the arm was placed into a shoulder immobilizer with an abduction pillow. A Polar Care system also was applied to the shoulder. The patient was then transferred back to a hospital bed before being awakened, extubated, and returned to the recovery room in satisfactory condition after tolerating the procedure well.

## 2023-12-18 NOTE — Anesthesia Preprocedure Evaluation (Signed)
 Anesthesia Evaluation  Patient identified by MRN, date of birth, ID band Patient awake    Reviewed: Allergy & Precautions, NPO status , Patient's Chart, lab work & pertinent test results  History of Anesthesia Complications Negative for: history of anesthetic complications  Airway Mallampati: I  TM Distance: >3 FB Neck ROM: Limited    Dental no notable dental hx.    Pulmonary sleep apnea and Continuous Positive Airway Pressure Ventilation , former smoker   Pulmonary exam normal        Cardiovascular hypertension, On Medications (-) angina (-) DOE Normal cardiovascular exam+ dysrhythmias Atrial Fibrillation + pacemaker   TTE was performed on 05/10/2016 revealing a normal left ventricular systolic function with an EF of >55%.  There were no regional wall motion abnormalities.  There was mild LVH.  Left atrium mildly enlarged.  There was trivial to mild aortic, mitral, and tricuspid valve regurgitation.  RVSP = 34.0 mmHg. All transvalvular gradients were noted to be normal providing no evidence suggestive of valvular stenosis. Aorta normal in size with appreciable ectasia or aneurysmal dilatation.    Neuro/Psych  Neuromuscular disease  negative psych ROS   GI/Hepatic Neg liver ROS,GERD  Medicated,,  Endo/Other  negative endocrine ROS    Renal/GU      Musculoskeletal   Abdominal   Peds  Hematology negative hematology ROS (+)   Anesthesia Other Findings Past Medical History: No date: Adenomatous polyp of colon No date: Anginal pain (HCC) No date: Bilateral carpal tunnel syndrome No date: BPH (benign prostatic hyperplasia) No date: Cardiomyopathy, secondary (HCC) No date: Cervicalgia No date: Cholelithiasis No date: Complication of anesthesia     Comment:  a.) ineffective neuraxial anesthesia for RIGHT TKR;               converted to GA course No date: DDD (degenerative disc disease), lumbar No date: Diverticulosis No  date: Erectile dysfunction No date: GERD (gastroesophageal reflux disease) 05/10/2016: History of echocardiogram     Comment:  a.) TTE 05/10/2016: EF >55%, mild LVH, mild LAE, normal               RVSF, triv AR, mild MR/TR No date: Hypertension No date: Left rotator cuff tear No date: Long term current use of immunosuppressive drug     Comment:  a.) hydroxychloroquine for pseudogout No date: Lumbosacral radiculitis No date: Obesity No date: On apixaban therapy No date: OSA on CPAP No date: Osteoarthritis No date: PAF (paroxysmal atrial fibrillation) (HCC)     Comment:  a.) CHA2DS2-VASc = 2 (age, HTN) as of 08/15/2023; b.)               cardiac rate/rhythm maintained intrinsically without               pharmacological intervention; chronically anticoagulated               using apixaban No date: Palpitations (PAC/PVCs) 06/28/2016: Presence of permanent cardiac pacemaker     Comment:  a.) Medtronic [id number] dual-chamber PPM (MRI compatiable);              LEFT pectoral area No date: Pseudogout No date: Renal cyst, right No date: RLS (restless legs syndrome) No date: Sinus bradycardia No date: SOB (shortness of breath) No date: SSS (sick sinus syndrome) (HCC)     Comment:  a.) s/p dual chamber PPM placement 06/28/2016  Past Surgical History: 04/24/2019: CARPAL TUNNEL RELEASE; Left     Comment:  Procedure: CARPAL TUNNEL RELEASE;  Surgeon: Rosita Kea,  Casimiro Needle, MD;  Location: [facility];  Service: Orthopedics;               Laterality: Left; 08/22/2023: CARPAL TUNNEL RELEASE; Right     Comment:  Procedure: CARPAL TUNNEL RELEASE ENDOSCOPIC;  Surgeon:               Christena Flake, MD;  Location: [facility];  Service:               Orthopedics;  Laterality: Right; No date: CHOLECYSTECTOMY 06/24/2015: COLONOSCOPY WITH PROPOFOL; N/A     Comment:  Procedure: COLONOSCOPY WITH PROPOFOL;  Surgeon: Wallace Cullens, MD;  Location: [facility];  Service:                Gastroenterology;  Laterality: N/A; 03/15/2021: COLONOSCOPY WITH PROPOFOL; N/A     Comment:  Procedure: COLONOSCOPY WITH PROPOFOL;  Surgeon:               Regis Bill, MD;  Location: [facility];                Service: Endoscopy;  Laterality: N/A;  ELIQUIS - WILL               NEED MEDICAL CLEARANCE 10/25/2015: KNEE ARTHROPLASTY; Left     Comment:  Procedure: COMPUTER ASSISTED TOTAL KNEE ARTHROPLASTY;                Surgeon: Donato Heinz, MD;  Location: [facility];                Service: Orthopedics;  Laterality: Left; No date: KNEE ARTHROSCOPY; Bilateral No date: LIPOMA EXCISION; N/A     Comment:  upper/mid aspect of back 06/28/2016: PACEMAKER INSERTION; N/A     Comment:  Procedure: INSERTION PACEMAKER;  Surgeon: Marcina Millard, MD;  Location: [facility];  Service:               Cardiovascular;  Laterality: N/A;  left No date: PHOTOSELECTIVE VAPORIZATION OF THE PROSTATE (GREENLIGHT  LASER); N/A     Comment:  x 2 12/18/2017: SHOULDER ARTHROSCOPY WITH OPEN ROTATOR CUFF REPAIR; Right     Comment:  Procedure: SHOULDER ARTHROSCOPY WITH OPEN ROTATOR CUFF               REPAIR;  Surgeon: Christena Flake, MD;  Location: [facility];              Service: Orthopedics;  Laterality: Right; 09/28/2010: TOTAL KNEE ARTHROPLASTY; Right No date: VASECTOMY  BMI    Body Mass Index: 39.75 kg/m      Reproductive/Obstetrics negative OB ROS                              Anesthesia Physical Anesthesia Plan  ASA: 3  Anesthesia Plan: General ETT   Post-op Pain Management: Regional block*, Toradol IV (intra-op)* and Ofirmev IV (intra-op)*   Induction: Intravenous  PONV Risk Score and Plan: 2 and Ondansetron, Dexamethasone, Midazolam and Treatment may vary due to age or medical condition  Airway Management Planned: Oral ETT  Additional Equipment:   Intra-op Plan:   Post-operative Plan: Extubation in OR  Informed Consent: I have reviewed  the patients History and Physical, chart, labs and discussed the procedure including the risks, benefits and  alternatives for the proposed anesthesia with the patient or authorized representative who has indicated his/her understanding and acceptance.     Dental Advisory Given  Plan Discussed with: Anesthesiologist, CRNA and Surgeon  Anesthesia Plan Comments: (Patient consented for risks of anesthesia including but not limited to:  - adverse reactions to medications - damage to eyes, teeth, lips or other oral mucosa - nerve damage due to positioning  - sore throat or hoarseness - Damage to heart, brain, nerves, lungs, other parts of body or loss of life  Patient voiced understanding and assent.)         Anesthesia Quick Evaluation

## 2023-12-18 NOTE — Evaluation (Signed)
 Occupational Therapy Evaluation Patient Details Name: [name] MRN: [id number] DOB: 23-Oct-1950 Today's Date: 12/18/2023   History of Present Illness   Pt is 73 y/o male s/p L reverse total shoulder arthroplasty.     Clinical Impressions Upon entering the room, pt seated in recliner chair with wife present in room for hands on education and training. OT reviewed precautions, H/W/E exercises, polar care system, sling use, and self care techniques to increased Ind with self care tasks. Pt's wife returned demonstrations and able to assist pt with UB self care, sling, and polar care with cuing for technique. Paper handout provided as well for pt and caregiver to refer to after discharge. OT answering all questions and all education completed. OT to complete orders at this time.      If plan is discharge home, recommend the following:   A little help with walking and/or transfers;A little help with bathing/dressing/bathroom;Assistance with cooking/housework;Assist for transportation;Help with stairs or ramp for entrance      Equipment Recommendations   None recommended by OT      Precautions/Restrictions   Precautions Precautions: Shoulder Shoulder Interventions: Roe Coombs joy ultra sling;Shoulder abduction pillow;At all times;Off for dressing/bathing/exercises Precaution Booklet Issued: Yes (comment) Recall of Precautions/Restrictions: Intact Restrictions Weight Bearing Restrictions Per Provider Order: Yes LUE Weight Bearing Per Provider Order: Non weight bearing     Mobility Bed Mobility               General bed mobility comments: seated in recliner chair    Transfers Overall transfer level: Needs assistance Equipment used: 1 person hand held assist Transfers: Sit to/from Stand Sit to Stand: Supervision                  Balance Overall balance assessment: Needs assistance Sitting-balance support: Feet supported Sitting balance-Leahy Scale: Good      Standing balance support: During functional activity, Single extremity supported Standing balance-Leahy Scale: Good                             ADL either performed or assessed with clinical judgement   ADL Overall ADL's : Needs assistance/impaired                                       General ADL Comments: S- CGA for mobility to bathroom with assistance from wife. Pt needing mod A for UB dressing and donning/doffing of sling.     Vision Patient Visual Report: No change from baseline              Pertinent Vitals/Pain Pain Assessment Pain Assessment: No/denies pain     Extremity/Trunk Assessment Upper Extremity Assessment Upper Extremity Assessment: Right hand dominant;LUE deficits/detail LUE Deficits / Details: NWB LUE: Unable to fully assess due to immobilization           Communication     Cognition Arousal: Alert Behavior During Therapy: WFL for tasks assessed/performed Cognition: No apparent impairments                                                  Home Living Family/patient expects to be discharged to:: Private residence Living Arrangements: Spouse/significant other Available Help at Discharge: Family Type of Home:  House Home Access: Level entry     Home Layout: Multi-level;Able to live on main level with bedroom/bathroom     Bathroom Shower/Tub: Walk-in shower         Home Equipment: Shower seat - built in          Prior Functioning/Environment Prior Level of Function : Independent/Modified Independent                            OT Goals(Current goals can be found in the care plan section)   Acute Rehab OT Goals Patient Stated Goal: to go home OT Goal Formulation: With patient/family Time For Goal Achievement: 12/18/23 Potential to Achieve Goals: Fair   OT Frequency:          AM-PAC OT "6 Clicks" Daily Activity     Outcome Measure Help from another person eating  meals?: None Help from another person taking care of personal grooming?: None Help from another person toileting, which includes using toliet, bedpan, or urinal?: A Little Help from another person bathing (including washing, rinsing, drying)?: A Little Help from another person to put on and taking off regular upper body clothing?: A Lot Help from another person to put on and taking off regular lower body clothing?: A Little 6 Kosier Score: 19   End of Session Nurse Communication: Mobility status  Activity Tolerance: Patient tolerated treatment well Patient left: in chair;with family/visitor present                   Time: 1259-1330 OT Time Calculation (min): 31 min Charges:  OT General Charges $OT Visit: 1 Visit OT Evaluation $OT Eval Low Complexity: 1 Low OT Treatments $Self Care/Home Management : 8-22 mins  Jackquline Denmark, MS, OTR/L , CBIS ascom [phone]  12/18/23, 1:44 PM

## 2023-12-18 NOTE — Transfer of Care (Signed)
 Immediate Anesthesia Transfer of Care Note  Patient: [name]  Procedure(s) Performed: ARTHROPLASTY, SHOULDER, TOTAL, REVERSE (Left: Shoulder)  Patient Location: PACU  Anesthesia Type:General  Level of Consciousness: awake, drowsy, and patient cooperative  Airway & Oxygen Therapy: Patient Spontanous Breathing and Patient connected to face mask oxygen  Post-op Assessment: Report given to RN and Post -op Vital signs reviewed and stable  Post vital signs: Reviewed and stable  Last Vitals:  Vitals Value Taken Time  BP 123/57 12/18/23 1020  Temp    Pulse 60 12/18/23 1025  Resp 15 12/18/23 1025  SpO2 100 % 12/18/23 1025  Vitals shown include unfiled device data.  Last Pain:  Vitals:   12/18/23 0627  TempSrc: Temporal  PainSc: 4       Patients Stated Pain Goal: 0 (12/18/23 9562)  Complications: No notable events documented.

## 2023-12-19 ENCOUNTER — Encounter: Payer: Self-pay | Admitting: Surgery

## 2023-12-24 ENCOUNTER — Encounter: Payer: Self-pay | Admitting: Surgery

## 2023-12-27 ENCOUNTER — Encounter: Payer: Self-pay | Admitting: Surgery

## 2024-02-26 ENCOUNTER — Ambulatory Visit (INDEPENDENT_AMBULATORY_CARE_PROVIDER_SITE_OTHER): Admitting: Urology

## 2024-02-26 VITALS — BP 152/95 | HR 87 | Ht 71.0 in | Wt 285.6 lb

## 2024-02-26 DIAGNOSIS — R399 Unspecified symptoms and signs involving the genitourinary system: Secondary | ICD-10-CM

## 2024-02-26 DIAGNOSIS — Z125 Encounter for screening for malignant neoplasm of prostate: Secondary | ICD-10-CM

## 2024-02-26 DIAGNOSIS — N432 Other hydrocele: Secondary | ICD-10-CM | POA: Diagnosis not present

## 2024-02-26 NOTE — Patient Instructions (Addendum)
 Stop your Eliquis 3 days prior to hydrocele aspiration procedure.  Hydrocele, Adult A hydrocele is a collection of fluid in the loose pouch of skin that holds the testicles (scrotum). It can occur in one or both testicles. This may happen because: The amount of fluid produced in the scrotum is not absorbed by the rest of the body. Fluid from the abdomen fills the scrotum. Normally, the testicles develop in the abdomen and then drop into the scrotum before birth. The tube that the testicles travel through usually closes after the testicles drop. If the tube does not close, fluid from the abdomen can fill the scrotum. This is not very common in adults. What are the causes? A hydrocele may be caused by: An injury to the scrotum. An infection. Decreased blood flow to the scrotum. Twisting of a testicle (testicular torsion). A birth defect. A tumor or cancer of the testicle. Sometimes, the cause is not known. What are the signs or symptoms? A hydrocele feels like a water-filled balloon. It may also feel heavy. Other symptoms include: Swelling of the scrotum. The swelling may decrease when you lie down. You may also notice more swelling at night than in the morning. This is called a communicating hydrocele, in which the fluid in the scrotum goes back into the abdominal cavity when the position of the scrotum changes. Swelling of the groin. Mild discomfort in the scrotum. Pain. This can develop if the hydrocele was caused by infection or twisting. The larger the hydrocele, the more likely you are to have pain. Swelling may also cause pain. How is this diagnosed? This condition may be diagnosed based on a physical exam and your medical history. You may also have tests, including: Imaging tests, such as an ultrasound. A transillumination test. This test takes place in a dark room where a light is placed on the skin of the scrotum. Clear liquid will not impede the light and the scrotum will be  illuminated. This helps a health care provider distinguish a hydrocele from a tumor. Blood or urine tests. How is this treated? Most hydroceles go away on their own. If you have no discomfort or pain, your health care provider may suggest close monitoring of your condition until the condition goes away or symptoms develop. This is called watch and wait or watchful waiting. If treatment is needed, it may include: Treating an underlying condition. This may include taking an antibiotic medicine to treat an infection. Having surgery to stop fluid from collecting in the scrotum. Having surgery to drain the fluid. Surgery may include: Hydrocelectomy. For this procedure, an incision is made in the scrotum to remove the fluid. Needle aspiration. A needle is used to drain fluid. However, the fluid buildup will come back quickly and may lead to an infection of the scrotum. This is rarely done. Follow these instructions at home: Medicines Take over-the-counter and prescription medicines only as told by your health care provider. If you were prescribed an antibiotic medicine, take it as told by your health care provider. Do not stop taking the antibiotic even if you start to feel better. General instructions Watch the hydrocele for any changes. Keep all follow-up visits. This is important. Contact a health care provider if: You notice any changes in the hydrocele. The swelling in your scrotum or groin gets worse. The hydrocele becomes red, firm, painful, or tender to the touch. You have a fever. Get help right away if you: Develop a lot of pain or your pain becomes  worse. Have chills. Have a high fever. Summary A hydrocele is a collection of fluid in the loose pouch of skin that holds the testicles (scrotum). A hydrocele can cause swelling, discomfort, and pain. In adults, the cause of a hydrocele may not be known. However, it is sometimes caused by an infection or the twisting of a  testicle. Hydroceles often go away on their own. If a hydrocele causes pain, treating the underlying cause may be needed to ease the pain. This information is not intended to replace advice given to you by your health care provider. Make sure you discuss any questions you have with your health care provider. Document Revised: 04/21/2021 Document Reviewed: 04/21/2021 Elsevier Patient Education  2024 [employer].

## 2024-02-26 NOTE — Progress Notes (Signed)
 02/26/24 9:08 AM   [name] Oct 13, 1950 [id number]  CC: Left hydrocele, PSA screening, BPH/LUTS  HPI: 73 year old male with morbid obesity and BMI of 40, cardiomyopathy, atrial fibrillation on Eliquis, OSA on CPAP who was previously followed by Dr. Fredrick Jenkins for the above issues.  He has undergone at least 2 prior hydrocele aspirations in clinic.  He also has a history of greenlight laser PVP in 2014.  He made an appointment today to discuss left hydrocele aspiration.  He is very resistant to surgical options.  The left hydrocele has become bulky and bothersome again.  His last aspiration was in 2023 with Dr. Sullivan Endow.  Mild urinary symptoms of urgency.  Has been on doxazosin  which is filled by PCP.  PSA June 2024 normal at 1.37   PMH: Past Medical History:  Diagnosis Date   Adenomatous polyp of colon    Anginal pain (HCC)    Bilateral carpal tunnel syndrome    BPH (benign prostatic hyperplasia)    Cardiomyopathy, secondary (HCC)    Cervicalgia    Cholelithiasis    Complication of anesthesia    a.) ineffective neuraxial anesthesia for RIGHT TKR; converted to GA course   DDD (degenerative disc disease), lumbar    Diverticulosis    Erectile dysfunction    GERD (gastroesophageal reflux disease)    History of echocardiogram 05/10/2016   a.) TTE 05/10/2016: EF >55%, mild LVH, mild LAE, normal RVSF, triv AR, mild MR/TR   Hypertension    Left rotator cuff tear    Long term current use of immunosuppressive drug    a.) hydroxychloroquine for pseudogout   Lumbosacral radiculitis    Obesity    On apixaban therapy    OSA on CPAP    Osteoarthritis    PAF (paroxysmal atrial fibrillation) (HCC)    a.) CHA2DS2-VASc = 2 (age, HTN) as of 08/15/2023; b.) cardiac rate/rhythm maintained intrinsically without pharmacological intervention; chronically anticoagulated using apixaban   Palpitations (PAC/PVCs)    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/2016   a.) Medtronic [id number]  dual-chamber PPM (MRI compatiable); LEFT pectoral area   Pseudogout    Renal cyst, right    RLS (restless legs syndrome)    Sinus bradycardia    SOB (shortness of breath)    SSS (sick sinus syndrome) (HCC)    a.) s/p dual chamber PPM placement 06/28/2016    Surgical History: Past Surgical History:  Procedure Laterality Date   CARPAL TUNNEL RELEASE Left 04/24/2019   Procedure: CARPAL TUNNEL RELEASE;  Surgeon: Molli Angelucci, MD;  Location: [facility];  Service: Orthopedics;  Laterality: Left;   CARPAL TUNNEL RELEASE Right 08/22/2023   Procedure: CARPAL TUNNEL RELEASE ENDOSCOPIC;  Surgeon: Elner Hahn, MD;  Location: [facility];  Service: Orthopedics;  Laterality: Right;   CHOLECYSTECTOMY     COLONOSCOPY WITH PROPOFOL  N/A 06/24/2015   Procedure: COLONOSCOPY WITH PROPOFOL ;  Surgeon: Stephens Eis, MD;  Location: [facility];  Service: Gastroenterology;  Laterality: N/A;   COLONOSCOPY WITH PROPOFOL  N/A 03/15/2021   Procedure: COLONOSCOPY WITH PROPOFOL ;  Surgeon: Shane Darling, MD;  Location: [facility];  Service: Endoscopy;  Laterality: N/A;  ELIQUIS - WILL NEED MEDICAL CLEARANCE   KNEE ARTHROPLASTY Left 10/25/2015   Procedure: COMPUTER ASSISTED TOTAL KNEE ARTHROPLASTY;  Surgeon: Arlyne Lame, MD;  Location: [facility];  Service: Orthopedics;  Laterality: Left;   KNEE ARTHROSCOPY Bilateral    LIPOMA EXCISION N/A    upper/mid aspect of back   PACEMAKER INSERTION N/A 06/28/2016  Procedure: INSERTION PACEMAKER;  Surgeon: Percival Brace, MD;  Location: [facility];  Service: Cardiovascular;  Laterality: N/A;  left   PHOTOSELECTIVE VAPORIZATION OF THE PROSTATE (GREENLIGHT LASER) N/A    x 2   REVERSE SHOULDER ARTHROPLASTY Left 12/18/2023   Procedure: ARTHROPLASTY, SHOULDER, TOTAL, REVERSE;  Surgeon: Elner Hahn, MD;  Location: [facility];  Service: Orthopedics;  Laterality: Left;   SHOULDER ARTHROSCOPY WITH OPEN ROTATOR CUFF REPAIR Right 12/18/2017   Procedure: SHOULDER ARTHROSCOPY WITH  OPEN ROTATOR CUFF REPAIR;  Surgeon: Elner Hahn, MD;  Location: [facility];  Service: Orthopedics;  Laterality: Right;   TOTAL KNEE ARTHROPLASTY Right 09/28/2010   VASECTOMY     Family History: Family History  Problem Relation Age of Onset   COPD Mother    Diabetes Father    Stroke Father    Heart disease Father     Social History:  reports that he quit smoking about 50 years ago. His smoking use included cigarettes. He started smoking about 55 years ago. He has a 5 pack-year smoking history. He has never used smokeless tobacco. He reports that he does not drink alcohol and does not use drugs.  Physical Exam: BP (!) 152/95 (BP Location: Left Arm, Patient Position: Sitting, Cuff Size: Large)   Pulse 87   Ht 5\' 11"  (1.803 m)   Wt 285 lb 9.6 oz (129.5 kg)   SpO2 97%   BMI 39.83 kg/m    Constitutional:  Alert and oriented, No acute distress. Cardiovascular: No clubbing, cyanosis, or edema. Respiratory: Normal respiratory effort, no increased work of breathing. GI: Abdomen is soft, nontender, nondistended, no abdominal masses GU: Moderate size left hydrocele  Laboratory Data: Reviewed, see HPI  Assessment & Plan:   73 year old male with morbid obesity, A-fib on Eliquis previously followed by Dr. Sullivan Endow for recurrent left hydrocele, PSA screening, and BPH status post greenlight laser PVP in 2014.  I reviewed those outside notes in the media tab at length.  Left hydrocele has recurred and is bulky and bothersome and he is interested in aspiration.  We discussed at length that surgical treatment with hydrocelectomy is a much more definitive procedure then aspiration in clinic, however he is extremely resistant to considering surgical treatment at this time.  Risk benefits were discussed extensively.  Using shared decision making he would like to pursue hydrocele aspiration in clinic with ultrasound guidance, will need to hold Eliquis 3 days prior.  -Schedule clinic left hydrocele aspiration  with ultrasound guidance -Continue doxazosin  for BPH through PCP -No further PSA screening needed per guideline recommendations   Jay Meth, MD 02/26/2024  [facility] [address], [address] [place], [place] [address] [phone]

## 2024-04-17 ENCOUNTER — Encounter: Payer: Self-pay | Admitting: Urology

## 2024-04-17 ENCOUNTER — Ambulatory Visit (INDEPENDENT_AMBULATORY_CARE_PROVIDER_SITE_OTHER): Admitting: Urology

## 2024-04-17 VITALS — BP 113/76 | HR 75 | Ht 71.0 in | Wt 285.0 lb

## 2024-04-17 DIAGNOSIS — N433 Hydrocele, unspecified: Secondary | ICD-10-CM

## 2024-04-17 MED ORDER — CEPHALEXIN 500 MG PO CAPS
500.0000 mg | ORAL_CAPSULE | Freq: Once | ORAL | Status: AC
  Administered 2024-04-17: 500 mg via ORAL

## 2024-04-17 NOTE — Progress Notes (Signed)
   04/17/24  Indication: Left hydrocele  Hydrocele aspiration procedure   Informed consent was obtained, and we discussed the risks of bleeding and infection/sepsis. A time out was performed to ensure correct patient identity.  Has had multiple  Pre-Procedure: - Keflex  given for prophylaxis - Bedside ultrasound performed with moderate size left hydrocele  Procedure: - Prepped and draped in standard sterile fashion - Lidocaine  injected into the left scrotal skin - 16 French Angiocath advanced into the hydrocele and needle removed, 250 mL straw-colored fluid removed  Post-Procedure: - Patient tolerated the procedure well - He was counseled to seek immediate medical attention if experiences significant bleeding, fevers, or severe pain   Assessment/ Plan: RTC 1 yr BPH symptoms follow up  Redell Burnet, MD 04/17/2024   Drop

## 2024-07-28 ENCOUNTER — Other Ambulatory Visit (HOSPITAL_COMMUNITY): Payer: Self-pay | Admitting: Physician Assistant

## 2024-07-28 DIAGNOSIS — R519 Headache, unspecified: Secondary | ICD-10-CM

## 2024-08-28 NOTE — CV Procedure (Signed)
°  Device system confirmed to be MRI conditional, with implant date > 6 weeks ago, and no evidence of abandoned or epicardial leads in review of most recent CXR  Device last cleared by EP Provider: Daphne Barrack 08/28/24  Clearance is good through for 1 year as long as parameters remain stable at time of check. If pt undergoes a cardiac device procedure during that time, they should be re-cleared.   Tachy-therapies to be programmed off if applicable with device back to pre-MRI settings after completion of exam.  [employer] - Programming recommendation received through [employer] App/Tablet  Rocky Catalan, RT  08/28/2024 3:39 PM

## 2024-09-02 ENCOUNTER — Ambulatory Visit (HOSPITAL_COMMUNITY)
Admission: RE | Admit: 2024-09-02 | Discharge: 2024-09-02 | Attending: Physician Assistant | Admitting: Physician Assistant

## 2024-09-02 DIAGNOSIS — R519 Headache, unspecified: Secondary | ICD-10-CM

## 2025-04-22 ENCOUNTER — Ambulatory Visit: Admitting: Urology
